# Patient Record
Sex: Male | Born: 1950
Health system: Southern US, Community
[De-identification: ages and names within clinical notes are randomized; demographics above are authoritative.]

## PROBLEM LIST (undated history)

## (undated) DIAGNOSIS — E785 Hyperlipidemia, unspecified: Secondary | ICD-10-CM

## (undated) DIAGNOSIS — K4090 Unilateral inguinal hernia, without obstruction or gangrene, not specified as recurrent: Secondary | ICD-10-CM

## (undated) DIAGNOSIS — G629 Polyneuropathy, unspecified: Secondary | ICD-10-CM

## (undated) DIAGNOSIS — I10 Essential (primary) hypertension: Secondary | ICD-10-CM

## (undated) DIAGNOSIS — F32A Depression, unspecified: Secondary | ICD-10-CM

## (undated) DIAGNOSIS — F329 Major depressive disorder, single episode, unspecified: Secondary | ICD-10-CM

## (undated) DIAGNOSIS — F988 Other specified behavioral and emotional disorders with onset usually occurring in childhood and adolescence: Secondary | ICD-10-CM

## (undated) DIAGNOSIS — R739 Hyperglycemia, unspecified: Secondary | ICD-10-CM

## (undated) HISTORY — PX: OTHER SURGICAL HISTORY: SHX169

## (undated) HISTORY — DX: Hyperlipidemia, unspecified: E78.5

## (undated) HISTORY — DX: Depression, unspecified: F32.A

## (undated) HISTORY — DX: Major depressive disorder, single episode, unspecified: F32.9

## (undated) HISTORY — DX: Essential (primary) hypertension: I10

## (undated) HISTORY — PX: RETINAL LASER PROCEDURE: SHX2339

## (undated) HISTORY — DX: Polyneuropathy, unspecified: G62.9

## (undated) HISTORY — PX: CHOLECYSTECTOMY: SHX55

## (undated) HISTORY — PX: EYE SURGERY: SHX253

## (undated) HISTORY — DX: Other specified behavioral and emotional disorders with onset usually occurring in childhood and adolescence: F98.8

## (undated) HISTORY — DX: Hyperglycemia, unspecified: R73.9

## (undated) HISTORY — DX: Unilateral inguinal hernia, without obstruction or gangrene, not specified as recurrent: K40.90

---

## 2000-09-04 ENCOUNTER — Ambulatory Visit (HOSPITAL_COMMUNITY): Admission: RE | Admit: 2000-09-04 | Discharge: 2000-09-04 | Payer: Self-pay | Admitting: Neurology

## 2000-09-04 ENCOUNTER — Encounter: Payer: Self-pay | Admitting: Neurology

## 2002-08-21 ENCOUNTER — Ambulatory Visit (HOSPITAL_BASED_OUTPATIENT_CLINIC_OR_DEPARTMENT_OTHER): Admission: RE | Admit: 2002-08-21 | Discharge: 2002-08-21 | Payer: Self-pay | Admitting: Geriatric Medicine

## 2004-04-25 ENCOUNTER — Encounter: Admission: RE | Admit: 2004-04-25 | Discharge: 2004-04-25 | Payer: Self-pay | Admitting: Geriatric Medicine

## 2012-01-17 ENCOUNTER — Other Ambulatory Visit: Payer: Self-pay | Admitting: Geriatric Medicine

## 2012-01-17 DIAGNOSIS — M79604 Pain in right leg: Secondary | ICD-10-CM

## 2012-01-18 ENCOUNTER — Ambulatory Visit
Admission: RE | Admit: 2012-01-18 | Discharge: 2012-01-18 | Disposition: A | Discharge status: Home / Self Care | Payer: 59 | Source: Ambulatory Visit | Attending: Geriatric Medicine | Admitting: Geriatric Medicine

## 2012-01-18 DIAGNOSIS — M79604 Pain in right leg: Secondary | ICD-10-CM

## 2015-11-08 DIAGNOSIS — Z23 Encounter for immunization: Secondary | ICD-10-CM | POA: Diagnosis not present

## 2016-04-28 DIAGNOSIS — Z125 Encounter for screening for malignant neoplasm of prostate: Secondary | ICD-10-CM | POA: Diagnosis not present

## 2016-04-28 DIAGNOSIS — E669 Obesity, unspecified: Secondary | ICD-10-CM | POA: Diagnosis not present

## 2016-04-28 DIAGNOSIS — E7800 Pure hypercholesterolemia, unspecified: Secondary | ICD-10-CM | POA: Diagnosis not present

## 2016-04-28 DIAGNOSIS — Z6832 Body mass index (BMI) 32.0-32.9, adult: Secondary | ICD-10-CM | POA: Diagnosis not present

## 2016-04-28 DIAGNOSIS — Z23 Encounter for immunization: Secondary | ICD-10-CM | POA: Diagnosis not present

## 2016-04-28 DIAGNOSIS — Z79899 Other long term (current) drug therapy: Secondary | ICD-10-CM | POA: Diagnosis not present

## 2016-04-28 DIAGNOSIS — F325 Major depressive disorder, single episode, in full remission: Secondary | ICD-10-CM | POA: Diagnosis not present

## 2016-04-28 DIAGNOSIS — Z0000 Encounter for general adult medical examination without abnormal findings: Secondary | ICD-10-CM | POA: Diagnosis not present

## 2016-04-28 DIAGNOSIS — I10 Essential (primary) hypertension: Secondary | ICD-10-CM | POA: Diagnosis not present

## 2016-04-28 DIAGNOSIS — G609 Hereditary and idiopathic neuropathy, unspecified: Secondary | ICD-10-CM | POA: Diagnosis not present

## 2016-08-03 DIAGNOSIS — M1711 Unilateral primary osteoarthritis, right knee: Secondary | ICD-10-CM | POA: Diagnosis not present

## 2016-08-03 DIAGNOSIS — M25561 Pain in right knee: Secondary | ICD-10-CM | POA: Diagnosis not present

## 2016-08-11 DIAGNOSIS — Z6832 Body mass index (BMI) 32.0-32.9, adult: Secondary | ICD-10-CM | POA: Diagnosis not present

## 2016-08-11 DIAGNOSIS — M13161 Monoarthritis, not elsewhere classified, right knee: Secondary | ICD-10-CM | POA: Diagnosis not present

## 2016-08-11 DIAGNOSIS — I10 Essential (primary) hypertension: Secondary | ICD-10-CM | POA: Diagnosis not present

## 2016-08-11 DIAGNOSIS — E669 Obesity, unspecified: Secondary | ICD-10-CM | POA: Diagnosis not present

## 2016-09-04 DIAGNOSIS — Z23 Encounter for immunization: Secondary | ICD-10-CM | POA: Diagnosis not present

## 2016-10-30 DIAGNOSIS — G609 Hereditary and idiopathic neuropathy, unspecified: Secondary | ICD-10-CM | POA: Diagnosis not present

## 2016-10-30 DIAGNOSIS — Z6832 Body mass index (BMI) 32.0-32.9, adult: Secondary | ICD-10-CM | POA: Diagnosis not present

## 2016-10-30 DIAGNOSIS — E7800 Pure hypercholesterolemia, unspecified: Secondary | ICD-10-CM | POA: Diagnosis not present

## 2016-10-30 DIAGNOSIS — E669 Obesity, unspecified: Secondary | ICD-10-CM | POA: Diagnosis not present

## 2016-10-30 DIAGNOSIS — R140 Abdominal distension (gaseous): Secondary | ICD-10-CM | POA: Diagnosis not present

## 2016-10-30 DIAGNOSIS — I10 Essential (primary) hypertension: Secondary | ICD-10-CM | POA: Diagnosis not present

## 2016-12-25 DIAGNOSIS — R51 Headache: Secondary | ICD-10-CM | POA: Diagnosis not present

## 2017-01-29 DIAGNOSIS — R140 Abdominal distension (gaseous): Secondary | ICD-10-CM | POA: Diagnosis not present

## 2017-01-29 DIAGNOSIS — R0789 Other chest pain: Secondary | ICD-10-CM | POA: Diagnosis not present

## 2017-01-29 DIAGNOSIS — Z79899 Other long term (current) drug therapy: Secondary | ICD-10-CM | POA: Diagnosis not present

## 2017-01-29 DIAGNOSIS — N529 Male erectile dysfunction, unspecified: Secondary | ICD-10-CM | POA: Diagnosis not present

## 2017-01-29 DIAGNOSIS — I10 Essential (primary) hypertension: Secondary | ICD-10-CM | POA: Diagnosis not present

## 2017-01-30 ENCOUNTER — Telehealth: Payer: Self-pay | Admitting: Cardiovascular Disease

## 2017-01-30 NOTE — Telephone Encounter (Signed)
Received records from [facility] for appointment on 02/13/17 with Dr Sallyanne Kuster.,  Records put with Dr Victorino December schedule for 02/13/17. lp

## 2017-01-31 DIAGNOSIS — N529 Male erectile dysfunction, unspecified: Secondary | ICD-10-CM | POA: Diagnosis not present

## 2017-02-07 DIAGNOSIS — E291 Testicular hypofunction: Secondary | ICD-10-CM | POA: Diagnosis not present

## 2017-02-07 DIAGNOSIS — Z0000 Encounter for general adult medical examination without abnormal findings: Secondary | ICD-10-CM | POA: Diagnosis not present

## 2017-02-07 DIAGNOSIS — I10 Essential (primary) hypertension: Secondary | ICD-10-CM | POA: Diagnosis not present

## 2017-02-07 DIAGNOSIS — R0789 Other chest pain: Secondary | ICD-10-CM | POA: Diagnosis not present

## 2017-02-07 DIAGNOSIS — Z125 Encounter for screening for malignant neoplasm of prostate: Secondary | ICD-10-CM | POA: Diagnosis not present

## 2017-02-13 ENCOUNTER — Ambulatory Visit (INDEPENDENT_AMBULATORY_CARE_PROVIDER_SITE_OTHER): Payer: PPO | Admitting: Cardiovascular Disease

## 2017-02-13 ENCOUNTER — Encounter: Payer: Self-pay | Admitting: Cardiovascular Disease

## 2017-02-13 VITALS — BP 130/81 | HR 73 | Ht 73.0 in | Wt 235.6 lb

## 2017-02-13 DIAGNOSIS — E7800 Pure hypercholesterolemia, unspecified: Secondary | ICD-10-CM | POA: Diagnosis not present

## 2017-02-13 DIAGNOSIS — I10 Essential (primary) hypertension: Secondary | ICD-10-CM

## 2017-02-13 DIAGNOSIS — R0789 Other chest pain: Secondary | ICD-10-CM

## 2017-02-13 NOTE — Patient Instructions (Signed)
Medication Instructions: Dr Sallyanne Kuster recommends that you continue on your current medications as directed. Please refer to the Current Medication list given to you today.  Labwork: NONE ORDERED  Testing/Procedures: 1. Exercise Tolerance Test - Your physician has requested that you have an exercise tolerance test. For further information please visit [email or web address]. Please also follow instruction sheet, as given. >>HOLD Bisoprolol on the day of the test  Follow-up: Dr Sallyanne Kuster recommends that you follow-up with him as needed.  If you need a refill on your cardiac medications before your next appointment, please call your pharmacy.

## 2017-02-13 NOTE — Progress Notes (Signed)
Cardiology Consultation Note    Date:  02/13/2017   ID:  DIESEL ZUEHLKE, DOB [birth year], MRN [id number]  PCP:  Mathews Argyle, MD  Cardiologist:   Sanda Klein, MD   Reason for consultation     . New Evaluation    chest pressure     History of Present Illness:  Jason Franco is a 66 y.o. male with a history of treated hypertension and hyperlipidemia recently reporting chest tightness at rest. For the last 3 months he has had intolerable belching/abdominal bloating. This occurs at rest and sometimes keeps him up at night or wakes him up early in the morning. He has uncontrollable burping. He has not had vomiting but has had recurrent dry heaves, which sometimes leads to chest tightness. The symptoms do not occur during physical activity. If anything being physically active makes him tolerated the complaint better. 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 This does not cause angina or chest tightness. Treatment with antacids and PPI has not helped.   He denies claudication, lower extremity edema, palpitations, syncope, focal neurological complaints, other cardiovascular problems. In the past he had a treadmill stress test that was not diagnostic since the beta blocker pre-dented his heart rate from increasingly. He had a normal echocardiogram except for mild mitral insufficiency. He has tried taking atorvastatin, rosuvastatin and simvastatin, but did not tolerate them due to muscle side effects.  Past Medical History:  Diagnosis Date  . ADD (attention deficit disorder)   . Cervical spine disease   . Depression   . Hyperglycemia   . Hyperlipidemia   . Hypertension   . Inguinal hernia    right; only felt with coughing  . Peripheral neuropathy [facility])     Past Surgical History:  Procedure Laterality Date  . GALLBLADDER SURGERY      Current Medications: Outpatient Medications Prior  to Visit  Medication Sig Dispense Refill  . bisoprolol (ZEBETA) 10 MG tablet Take 10 mg by mouth daily.    Marland Kitchen FLUoxetine (PROZAC) 20 MG capsule Take 20 mg by mouth daily as needed.    . lansoprazole (PREVACID) 15 MG capsule Take 15 mg by mouth daily at 12 noon.    . traZODone (DESYREL) 50 MG tablet Take 50 mg by mouth at bedtime as needed for sleep.    . fluvastatin (LESCOL) 20 MG capsule Take 20 mg by mouth at bedtime.     No facility-administered medications prior to visit.      Allergies:   Atorvastatin; Rosuvastatin calcium; Simvastatin; and Tamsulosin hcl   Social History   Social History  . Marital status: Married    Spouse name: N/A  . Number of children: N/A  . Years of education: N/A   Social History Main Topics  . Smoking status: Never Smoker  . Smokeless tobacco: Never Used  . Alcohol use No  . Drug use: Unknown  . Sexual activity: Not Asked   Other Topics Concern  . None   Social History Narrative  . None     Family History:  The patient's family history includes Alzheimer's disease in his brother and mother; COPD in his sister; Cancer in his brother and father; Diabetes in his brother; Fibromyalgia in his sister; Healthy in his son, son, and son; Heart failure in his mother and sister; Parkinson's disease in his father.   ROS:   Please see the history of present illness.  ROS All other systems reviewed and are negative.   PHYSICAL EXAM:   VS:  BP 130/81   Pulse 73   Ht 6\' 1"  (1.854 m)   Wt 106.9 kg (235 lb 9.6 oz)   BMI 31.08 kg/m    GEN: Well nourished, well developed, in no acute distress  HEENT: normal  Neck: no JVD, carotid bruits, or masses Cardiac: RRR; no murmurs, rubs, or gallops,no edema  Respiratory:  clear to auscultation bilaterally, normal work of breathing GI: soft, nontender, nondistended, + BS MS: no deformity or atrophy  Skin: warm and dry, no rash, varicose veins of both calves and thighs, possibly a thrombosed varix on the  medial surface of the lower right thigh Neuro:  Alert and Oriented x 3, Strength and sensation are intact Psych: euthymic mood, full affect  Wt Readings from Last 3 Encounters:  02/13/17 106.9 kg (235 lb 9.6 oz)      Studies/Labs Reviewed:   EKG:  EKG is ordered today.  The ekg ordered today demonstratesNormal sinus rhythm, normal tracing, QTC 413 ms  Recent Labs: Glucose 74, creatinine 1.19, potassium 4.3, normal liver function tests, TSH 1.14, mildly reduced testosterone 223   Lipid Panel: not available   Additional studies/ records that were reviewed today include: Notes from Dr. Felipa Eth   ASSESSMENT:    1. Chest pressure   2. Essential hypertension   3. Hypercholesterolemia      PLAN:  In order of problems listed above:  1. Atypical CP: This sounds distinctly not anginal. It occurs at rest and improves during heavy physical activity. He does have coronary risk factors include hypertension, hyperlipidemia, male gender and relatively advanced age. His mother and sister have heart failure, but it does not sound like they have coronary problems. I recommended that he have a treadmill stress test, after stopping his beta blocker for at least 24 hours.  2. HTN: Well-controlled . 3. HLP: intolerant to statin, get most recent results from Dr. Felipa Eth    if the treadmill stress test is normal, without exercise-induced chest tightness or ECG changes, I would not pursue additional cardiac workup at this time. Consider referral to GI for upper endoscopy.   Medication Adjustments/Labs and Tests Ordered: Current medicines are reviewed at length with the patient today.  Concerns regarding medicines are outlined above.  Medication changes, Labs and Tests ordered today are listed in the Patient Instructions below. Patient Instructions  Medication Instructions: Dr Sallyanne Kuster recommends that you continue on your current medications as directed. Please refer to the Current Medication list  given to you today.  Labwork: NONE ORDERED  Testing/Procedures: 1. Exercise Tolerance Test - Your physician has requested that you have an exercise tolerance test. For further information please visit [email or web address]. Please also follow instruction sheet, as given. >>HOLD Bisoprolol on the day of the test  Follow-up: Dr Sallyanne Kuster recommends that you follow-up with him as needed.  If you need a refill on your cardiac medications before your next appointment, please call your pharmacy.    Signed, Sanda Klein, MD  02/13/2017 1:11 PM    [facility] [address], [place], [place]  [address] Phone: [phone]; Fax: [phone]

## 2017-02-14 ENCOUNTER — Telehealth (HOSPITAL_COMMUNITY): Payer: Self-pay

## 2017-02-14 NOTE — Telephone Encounter (Signed)
Encounter complete. 

## 2017-02-16 ENCOUNTER — Ambulatory Visit (HOSPITAL_COMMUNITY)
Admission: RE | Admit: 2017-02-16 | Discharge: 2017-02-16 | Disposition: A | Discharge status: Home / Self Care | Payer: PPO | Source: Ambulatory Visit | Attending: Cardiovascular Disease | Admitting: Cardiovascular Disease

## 2017-02-16 DIAGNOSIS — R0789 Other chest pain: Secondary | ICD-10-CM | POA: Diagnosis not present

## 2017-02-16 DIAGNOSIS — R0600 Dyspnea, unspecified: Secondary | ICD-10-CM | POA: Insufficient documentation

## 2017-02-16 LAB — EXERCISE TOLERANCE TEST
CHL RATE OF PERCEIVED EXERTION: 18
CSEPED: 9 min
CSEPEW: 10.8 METS
CSEPHR: 86 %
Exercise duration (sec): 27 s
MPHR: 154 {beats}/min
Peak HR: 133 {beats}/min
Rest HR: 63 {beats}/min

## 2017-02-22 ENCOUNTER — Telehealth: Payer: Self-pay | Admitting: Cardiovascular Disease

## 2017-02-22 NOTE — Telephone Encounter (Signed)
F/u Message  Pt returning RN call about ETT results. Please call back  To discuss

## 2017-03-01 NOTE — Telephone Encounter (Signed)
pt aware of results  

## 2017-03-13 DIAGNOSIS — I10 Essential (primary) hypertension: Secondary | ICD-10-CM | POA: Diagnosis not present

## 2017-03-13 DIAGNOSIS — R197 Diarrhea, unspecified: Secondary | ICD-10-CM | POA: Diagnosis not present

## 2017-03-15 ENCOUNTER — Encounter (HOSPITAL_COMMUNITY): Payer: Self-pay | Admitting: *Deleted

## 2017-03-15 ENCOUNTER — Other Ambulatory Visit: Payer: Self-pay | Admitting: Gastroenterology

## 2017-03-20 ENCOUNTER — Ambulatory Visit (HOSPITAL_COMMUNITY)
Admission: RE | Admit: 2017-03-20 | Discharge: 2017-03-20 | Disposition: A | Discharge status: Home / Self Care | Payer: PPO | Source: Ambulatory Visit | Attending: Gastroenterology | Admitting: Gastroenterology

## 2017-03-20 ENCOUNTER — Encounter (HOSPITAL_COMMUNITY): Payer: Self-pay | Admitting: *Deleted

## 2017-03-20 ENCOUNTER — Encounter (HOSPITAL_COMMUNITY)
Admission: RE | Disposition: A | Discharge status: Home / Self Care | Payer: Self-pay | Source: Ambulatory Visit | Attending: Gastroenterology

## 2017-03-20 ENCOUNTER — Ambulatory Visit (HOSPITAL_COMMUNITY): Payer: PPO | Admitting: Certified Registered"

## 2017-03-20 DIAGNOSIS — Z9049 Acquired absence of other specified parts of digestive tract: Secondary | ICD-10-CM | POA: Insufficient documentation

## 2017-03-20 DIAGNOSIS — K900 Celiac disease: Secondary | ICD-10-CM | POA: Diagnosis not present

## 2017-03-20 DIAGNOSIS — R197 Diarrhea, unspecified: Secondary | ICD-10-CM | POA: Diagnosis not present

## 2017-03-20 DIAGNOSIS — F988 Other specified behavioral and emotional disorders with onset usually occurring in childhood and adolescence: Secondary | ICD-10-CM | POA: Diagnosis not present

## 2017-03-20 DIAGNOSIS — R140 Abdominal distension (gaseous): Secondary | ICD-10-CM | POA: Diagnosis not present

## 2017-03-20 DIAGNOSIS — K5730 Diverticulosis of large intestine without perforation or abscess without bleeding: Secondary | ICD-10-CM | POA: Diagnosis not present

## 2017-03-20 DIAGNOSIS — M5030 Other cervical disc degeneration, unspecified cervical region: Secondary | ICD-10-CM | POA: Insufficient documentation

## 2017-03-20 DIAGNOSIS — D122 Benign neoplasm of ascending colon: Secondary | ICD-10-CM | POA: Insufficient documentation

## 2017-03-20 DIAGNOSIS — F329 Major depressive disorder, single episode, unspecified: Secondary | ICD-10-CM | POA: Diagnosis not present

## 2017-03-20 DIAGNOSIS — I10 Essential (primary) hypertension: Secondary | ICD-10-CM | POA: Diagnosis not present

## 2017-03-20 DIAGNOSIS — K52839 Microscopic colitis, unspecified: Secondary | ICD-10-CM | POA: Diagnosis not present

## 2017-03-20 DIAGNOSIS — R142 Eructation: Secondary | ICD-10-CM | POA: Insufficient documentation

## 2017-03-20 DIAGNOSIS — G629 Polyneuropathy, unspecified: Secondary | ICD-10-CM | POA: Diagnosis not present

## 2017-03-20 DIAGNOSIS — K529 Noninfective gastroenteritis and colitis, unspecified: Secondary | ICD-10-CM | POA: Diagnosis not present

## 2017-03-20 DIAGNOSIS — K317 Polyp of stomach and duodenum: Secondary | ICD-10-CM | POA: Insufficient documentation

## 2017-03-20 DIAGNOSIS — E7800 Pure hypercholesterolemia, unspecified: Secondary | ICD-10-CM | POA: Diagnosis not present

## 2017-03-20 DIAGNOSIS — Z1211 Encounter for screening for malignant neoplasm of colon: Secondary | ICD-10-CM | POA: Diagnosis not present

## 2017-03-20 HISTORY — PX: ESOPHAGOGASTRODUODENOSCOPY (EGD) WITH PROPOFOL: SHX5813

## 2017-03-20 HISTORY — PX: COLONOSCOPY WITH PROPOFOL: SHX5780

## 2017-03-20 SURGERY — COLONOSCOPY WITH PROPOFOL
Anesthesia: Monitor Anesthesia Care

## 2017-03-20 MED ORDER — SODIUM CHLORIDE 0.9 % IV SOLN: INTRAVENOUS | Status: DC

## 2017-03-20 MED ORDER — PROPOFOL 10 MG/ML IV BOLUS
INTRAVENOUS | Status: AC
  Filled 2017-03-20: qty 40

## 2017-03-20 MED ORDER — ONDANSETRON HCL 4 MG/2ML IJ SOLN
INTRAMUSCULAR | Status: DC | PRN
  Administered 2017-03-20: 4 mg via INTRAVENOUS

## 2017-03-20 MED ORDER — LIDOCAINE 2% (20 MG/ML) 5 ML SYRINGE
INTRAMUSCULAR | Status: DC | PRN
  Administered 2017-03-20: 40 mg via INTRAVENOUS

## 2017-03-20 MED ORDER — LACTATED RINGERS IV SOLN
INTRAVENOUS | Status: DC
  Administered 2017-03-20 (×2): via INTRAVENOUS

## 2017-03-20 MED ORDER — PROPOFOL 10 MG/ML IV BOLUS
INTRAVENOUS | Status: DC | PRN
  Administered 2017-03-20 (×13): 40 mg via INTRAVENOUS

## 2017-03-20 MED ORDER — PHENYLEPHRINE 40 MCG/ML (10ML) SYRINGE FOR IV PUSH (FOR BLOOD PRESSURE SUPPORT)
PREFILLED_SYRINGE | INTRAVENOUS | Status: DC | PRN
  Administered 2017-03-20: 120 ug via INTRAVENOUS

## 2017-03-20 MED ORDER — PROPOFOL 10 MG/ML IV BOLUS
INTRAVENOUS | Status: AC
  Filled 2017-03-20: qty 20

## 2017-03-20 SURGICAL SUPPLY — 24 items

## 2017-03-20 NOTE — Anesthesia Preprocedure Evaluation (Addendum)
Anesthesia Evaluation  Patient identified by MRN, date of birth, ID band Patient awake    Reviewed: Allergy & Precautions, NPO status , Patient's Chart, lab work & pertinent test results  Airway Mallampati: I  TM Distance: >3 FB Neck ROM: Full    Dental  (+) Dental Advisory Given   Pulmonary    Pulmonary exam normal        Cardiovascular hypertension, Pt. on medications Normal cardiovascular exam     Neuro/Psych    GI/Hepatic   Endo/Other    Renal/GU      Musculoskeletal   Abdominal   Peds  Hematology   Anesthesia Other Findings   Reproductive/Obstetrics                            Anesthesia Physical Anesthesia Plan  ASA: II  Anesthesia Plan: MAC   Post-op Pain Management:    Induction: Intravenous  Airway Management Planned: Simple Face Mask  Additional Equipment:   Intra-op Plan:   Post-operative Plan:   Informed Consent: I have reviewed the patients History and Physical, chart, labs and discussed the procedure including the risks, benefits and alternatives for the proposed anesthesia with the patient or authorized representative who has indicated his/her understanding and acceptance.     Plan Discussed with: CRNA and Surgeon  Anesthesia Plan Comments:         Anesthesia Quick Evaluation

## 2017-03-20 NOTE — Anesthesia Postprocedure Evaluation (Signed)
Anesthesia Post Note  Patient: Wayburn Shaler Doolittle  Procedure(s) Performed: Procedure(s) (LRB): COLONOSCOPY WITH PROPOFOL (N/A) ESOPHAGOGASTRODUODENOSCOPY (EGD) WITH PROPOFOL (N/A)  Patient location during evaluation: PACU Anesthesia Type: General Level of consciousness: awake and alert Pain management: pain level controlled Vital Signs Assessment: post-procedure vital signs reviewed and stable Respiratory status: spontaneous breathing, nonlabored ventilation, respiratory function stable and patient connected to nasal cannula oxygen Cardiovascular status: blood pressure returned to baseline and stable Postop Assessment: no signs of nausea or vomiting Anesthetic complications: no       Last Vitals:  Vitals:   03/20/17 1201 03/20/17 1209  BP: 118/70 (!) 142/75  Pulse: 60 (!) 58  Resp: 18 12  Temp:      Last Pain:  Vitals:   03/20/17 1149  TempSrc: Oral                 Jouri Threat DAVID

## 2017-03-20 NOTE — Transfer of Care (Signed)
Immediate Anesthesia Transfer of Care Note  Patient: Jason Franco  Procedure(s) Performed: Procedure(s): COLONOSCOPY WITH PROPOFOL (N/A) ESOPHAGOGASTRODUODENOSCOPY (EGD) WITH PROPOFOL (N/A)  Patient Location: PACU and Endoscopy Unit  Anesthesia Type:MAC  Level of Consciousness: awake and alert   Airway & Oxygen Therapy: Patient Spontanous Breathing and Patient connected to nasal cannula oxygen  Post-op Assessment: Report given to RN and Post -op Vital signs reviewed and stable  Post vital signs: Reviewed and stable  Last Vitals:  Vitals:   03/20/17 1018  BP: (!) 144/83  Pulse: (!) 56  Resp: (!) 21  Temp: 36.4 C    Last Pain:  Vitals:   03/20/17 1018  TempSrc: Oral         Complications: No apparent anesthesia complications

## 2017-03-20 NOTE — Op Note (Signed)
[facility] Patient Name: Jason Franco Procedure Date: 03/20/2017 MRN: [id number] Attending MD: Garlan Fair , MD Date of Birth: 02/26/1951 CSN: [id number] Age: 66 Admit Type: Outpatient Procedure:                Colonoscopy Indications:              Screening for colorectal malignant neoplasm Providers:                Garlan Fair, MD, Carolynn Comment, RN,                            William Dalton, Technician Referring MD:              Medicines:                Propofol per Anesthesia Complications:            No immediate complications. Estimated Blood Loss:     Estimated blood loss was minimal. Procedure:                Pre-Anesthesia Assessment:                           - Prior to the procedure, a History and Physical                            was performed, and patient medications and                            allergies were reviewed. The patient's tolerance of                            previous anesthesia was also reviewed. The risks                            and benefits of the procedure and the sedation                            options and risks were discussed with the patient.                            All questions were answered, and informed consent                            was obtained. Prior Anticoagulants: The patient has                            taken no previous anticoagulant or antiplatelet                            agents. ASA Grade Assessment: II - A patient with                            mild systemic disease. After reviewing the risks  and benefits, the patient was deemed in                            satisfactory condition to undergo the procedure.                           After obtaining informed consent, the colonoscope                            was passed under direct vision. Throughout the                            procedure, the patient's blood pressure, pulse, and     oxygen saturations were monitored continuously. The                            EC-[id number] ([id number]) scope was introduced through                            the anus and advanced to the the cecum, identified                            by appendiceal orifice and ileocecal valve. The                            colonoscopy was performed without difficulty. The                            patient tolerated the procedure well. The quality                            of the bowel preparation was good. The appendiceal                            orifice and the rectum were photographed. Scope In: 11:20:05 AM Scope Out: 11:40:43 AM Scope Withdrawal Time: 0 hours 11 minutes 18 seconds  Total Procedure Duration: 0 hours 20 minutes 38 seconds  Findings:      The perianal and digital rectal examinations were normal.      The colon appeared normal except for left colonic diverticulosis.       Biopsies for histology were taken with a cold forceps from the cecum,       ascending colon and descending colon for evaluation of microscopic       colitis. A 2 mm polyp in the proximal ascending colon was added to the       random colon biopsy pathology bottle. Impression:               - The entire examined colon is normal. Biopsied. Moderate Sedation:      N/A- Per Anesthesia Care Recommendation:           - Patient has a contact number available for                            emergencies. The signs and symptoms of potential  delayed complications were discussed with the                            patient. Return to normal activities tomorrow.                            Written discharge instructions were provided to the                            patient.                           - Repeat colonoscopy date to be determined after                            pending pathology results are reviewed for                            screening purposes.                           - Resume  previous diet.                           - Continue present medications. Procedure Code(s):        --- Professional ---                           [phone], Colonoscopy, flexible; with biopsy, single                            or multiple Diagnosis Code(s):        --- Professional ---                           Z12.11, Encounter for screening for malignant                            neoplasm of colon CPT copyright 2016 American Medical Association. All rights reserved. The codes documented in this report are preliminary and upon coder review may  be revised to meet current compliance requirements. Earle Gell, MD Garlan Fair, MD 03/20/2017 11:50:47 AM This report has been signed electronically. Number of Addenda: 0

## 2017-03-20 NOTE — H&P (Signed)
Problem: Chronic watery diarrhea with abdominal bloating and frequent belching. Normal screening colonoscopy was performed in 2008. Chronic fluoxetine and Prevacid use which are associated with microscopic colitis.  History: The patient is a 66 year old male born 24-Jul-1951. For the past few months, he has had excessive belching with upper abdominal bloating and a "queasy" stomach without dysphagia, odynophagia, heartburn, nausea, vomiting, or weight loss. He also has had intermittent watery diarrhea without gastrointestinal bleeding or nocturnal diarrhea.  In 2006, his esophagogastroduodenoscopy did not show Barrett's esophagus. He underwent a normal screening colonoscopy in 2008. He denies a history of peptic ulcer disease or gluten sensitivity.  He is scheduled to undergo diagnostic esophagogastroduodenoscopy with screen for celiac disease followed by screening colonoscopy with screen for microscopic colitis.  Past medical history: Hypercholesterolemia. Hypertension. Depression. Cervical spine disease. Attention deficit disorder. Right inguinal hernia. Peripheral neuropathy. Cholecystectomy. Left inguinal herniorrhaphy. Right carpal tunnel surgery.  Exam: The patient is alert and lying comfortably on the endoscopy stretcher. Abdomen is soft and nontender to palpation. Lungs are clear to auscultation. Cardiac exam reveals a right rhythm.  Plan: Proceed with diagnostic esophagogastroduodenoscopy with screen for celiac disease followed by screening colonoscopy and screen for microscopic colitis.

## 2017-03-20 NOTE — Discharge Instructions (Signed)
Colonoscopy, Adult, Care After  This sheet gives you information about how to care for yourself after your procedure. Your doctor may also give you more specific instructions. If you have problems or questions, call your doctor.  Follow these instructions at home:  General instructions     · For the first 24 hours after the procedure:  ? Do not drive or use machinery.  ? Do not sign important documents.  ? Do not drink alcohol.  ? Do your daily activities more slowly than normal.  ? Eat foods that are soft and easy to digest.  ? Rest often.  · Take over-the-counter or prescription medicines only as told by your doctor.  · It is up to you to get the results of your procedure. Ask your doctor, or the department performing the procedure, when your results will be ready.  To help cramping and bloating:   · Try walking around.  · Put heat on your belly (abdomen) as told by your doctor. Use a heat source that your doctor recommends, such as a moist heat pack or a heating pad.  ? Put a towel between your skin and the heat source.  ? Leave the heat on for 20-30 minutes.  ? Remove the heat if your skin turns bright red. This is especially important if you cannot feel pain, heat, or cold. You can get burned.  Eating and drinking   · Drink enough fluid to keep your pee (urine) clear or pale yellow.  · Return to your normal diet as told by your doctor. Avoid heavy or fried foods that are hard to digest.  · Avoid drinking alcohol for as long as told by your doctor.  Contact a doctor if:  · You have blood in your poop (stool) 2-3 days after the procedure.  Get help right away if:  · You have more than a small amount of blood in your poop.  · You see large clumps of tissue (blood clots) in your poop.  · Your belly is swollen.  · You feel sick to your stomach (nauseous).  · You throw up (vomit).  · You have a fever.  · You have belly pain that gets worse, and medicine does not help your pain.  This information is not intended to  replace advice given to you by your health care provider. Make sure you discuss any questions you have with your health care provider.  Document Released: 01/06/2011 Document Revised: 08/28/2016 Document Reviewed: 08/28/2016  Elsevier Interactive Patient Education © 2017 [employer]  Upper Endoscopy, Care After  Refer to this sheet in the next few weeks. These instructions provide you with information about caring for yourself after your procedure. Your health care provider may also give you more specific instructions. Your treatment has been planned according to current medical practices, but problems sometimes occur. Call your health care provider if you have any problems or questions after your procedure.  What can I expect after the procedure?  After the procedure, it is common to have:  · A sore throat.  · Bloating.  · Nausea.    Follow these instructions at home:  · Follow instructions from your health care provider about what to eat or drink after your procedure.  · Return to your normal activities as told by your health care provider. Ask your health care provider what activities are safe for you.  · Take over-the-counter and prescription medicines only as told by your health care provider.  ·   Do not drive for 24 hours if you received a sedative.  · Keep all follow-up visits as told by your health care provider. This is important.  Contact a health care provider if:  · You have a sore throat that lasts longer than one day.  · You have trouble swallowing.  Get help right away if:  · You have a fever.  · You vomit blood or your vomit looks like coffee grounds.  · You have bloody, black, or tarry stools.  · You have a severe sore throat or you cannot swallow.  · You have difficulty breathing.  · You have severe pain in your chest or belly.  This information is not intended to replace advice given to you by your health care provider. Make sure you discuss any questions you have with your health care  provider.  Document Released: 06/04/2012 Document Revised: 05/11/2016 Document Reviewed: 09/16/2015  Elsevier Interactive Patient Education © 2017 [employer]

## 2017-03-20 NOTE — Op Note (Signed)
[facility] Patient Name: Jason Franco Procedure Date: 03/20/2017 MRN: [id number] Attending MD: Garlan Fair , MD Date of Birth: 1950-12-27 CSN: [id number] Age: 66 Admit Type: Outpatient Procedure:                Upper GI endoscopy Indications:              Diarrhea Providers:                Garlan Fair, MD, Carolynn Comment, RN,                            William Dalton, Technician Referring MD:              Medicines:                Propofol per Anesthesia Complications:            No immediate complications. Estimated Blood Loss:     Estimated blood loss was minimal. Procedure:                Pre-Anesthesia Assessment:                           - Prior to the procedure, a History and Physical                            was performed, and patient medications and                            allergies were reviewed. The patient's tolerance of                            previous anesthesia was also reviewed. The risks                            and benefits of the procedure and the sedation                            options and risks were discussed with the patient.                            All questions were answered, and informed consent                            was obtained. Prior Anticoagulants: The patient has                            taken no previous anticoagulant or antiplatelet                            agents. ASA Grade Assessment: II - A patient with                            mild systemic disease. After reviewing the risks  and benefits, the patient was deemed in                            satisfactory condition to undergo the procedure.                           - Prior to the procedure, a History and Physical                            was performed, and patient medications and                            allergies were reviewed. The patient's tolerance of                            previous anesthesia was also  reviewed. The risks                            and benefits of the procedure and the sedation                            options and risks were discussed with the patient.                            All questions were answered, and informed consent                            was obtained. Prior Anticoagulants: The patient has                            taken no previous anticoagulant or antiplatelet                            agents. ASA Grade Assessment: II - A patient with                            mild systemic disease. After reviewing the risks                            and benefits, the patient was deemed in                            satisfactory condition to undergo the procedure.                           After obtaining informed consent, the endoscope was                            passed under direct vision. Throughout the                            procedure, the patient's blood pressure, pulse, and  oxygen saturations were monitored continuously. The                            Endoscope was introduced through the mouth, and                            advanced to the second part of duodenum. The upper                            GI endoscopy was accomplished without difficulty.                            The patient tolerated the procedure well. Scope In: Scope Out: Findings:      The Z-line was regular and was found 42 cm from the incisors.      The examined esophagus was normal.      The entire examined stomach was normal except for the presence of       scattered <3 mm benign fundic gland appearing sessile polyps in the       fundus..      The examined duodenum was normal. Biopsies for histology were taken with       a cold forceps for evaluation of celiac disease. Impression:               - Z-line regular, 42 cm from the incisors.                           - Normal esophagus.                           - Normal stomach.                            - Normal examined duodenum. Biopsied. Moderate Sedation:      N/A- Per Anesthesia Care Recommendation:           - Patient has a contact number available for                            emergencies. The signs and symptoms of potential                            delayed complications were discussed with the                            patient. Return to normal activities tomorrow.                            Written discharge instructions were provided to the                            patient.                           - Await pathology results.                           -  Resume previous diet.                           - Continue present medications. Procedure Code(s):        --- Professional ---                           [phone], Esophagogastroduodenoscopy, flexible,                            transoral; with biopsy, single or multiple Diagnosis Code(s):        --- Professional ---                           R19.7, Diarrhea, unspecified CPT copyright 2016 [employer]. All rights reserved. The codes documented in this report are preliminary and upon coder review may  be revised to meet current compliance requirements. Earle Gell, MD Garlan Fair, MD 03/20/2017 11:56:00 AM This report has been signed electronically. Number of Addenda: 0

## 2017-03-21 ENCOUNTER — Encounter (HOSPITAL_COMMUNITY): Payer: Self-pay | Admitting: Gastroenterology

## 2017-05-09 DIAGNOSIS — I10 Essential (primary) hypertension: Secondary | ICD-10-CM | POA: Diagnosis not present

## 2017-05-09 DIAGNOSIS — E291 Testicular hypofunction: Secondary | ICD-10-CM | POA: Diagnosis not present

## 2017-06-01 DIAGNOSIS — Z79899 Other long term (current) drug therapy: Secondary | ICD-10-CM | POA: Diagnosis not present

## 2017-06-01 DIAGNOSIS — I10 Essential (primary) hypertension: Secondary | ICD-10-CM | POA: Diagnosis not present

## 2017-06-01 DIAGNOSIS — E291 Testicular hypofunction: Secondary | ICD-10-CM | POA: Diagnosis not present

## 2017-06-01 DIAGNOSIS — Z23 Encounter for immunization: Secondary | ICD-10-CM | POA: Diagnosis not present

## 2017-06-01 DIAGNOSIS — R7301 Impaired fasting glucose: Secondary | ICD-10-CM | POA: Diagnosis not present

## 2017-06-01 DIAGNOSIS — F458 Other somatoform disorders: Secondary | ICD-10-CM | POA: Diagnosis not present

## 2017-06-01 DIAGNOSIS — E7800 Pure hypercholesterolemia, unspecified: Secondary | ICD-10-CM | POA: Diagnosis not present

## 2017-09-04 DIAGNOSIS — E291 Testicular hypofunction: Secondary | ICD-10-CM | POA: Diagnosis not present

## 2017-09-04 DIAGNOSIS — I10 Essential (primary) hypertension: Secondary | ICD-10-CM | POA: Diagnosis not present

## 2017-09-04 DIAGNOSIS — Z23 Encounter for immunization: Secondary | ICD-10-CM | POA: Diagnosis not present

## 2017-09-04 DIAGNOSIS — R140 Abdominal distension (gaseous): Secondary | ICD-10-CM | POA: Diagnosis not present

## 2017-09-04 DIAGNOSIS — E669 Obesity, unspecified: Secondary | ICD-10-CM | POA: Diagnosis not present

## 2017-09-04 DIAGNOSIS — Z79899 Other long term (current) drug therapy: Secondary | ICD-10-CM | POA: Diagnosis not present

## 2017-09-17 DIAGNOSIS — R102 Pelvic and perineal pain: Secondary | ICD-10-CM | POA: Diagnosis not present

## 2017-10-08 DIAGNOSIS — H43812 Vitreous degeneration, left eye: Secondary | ICD-10-CM | POA: Diagnosis not present

## 2017-10-08 DIAGNOSIS — H3562 Retinal hemorrhage, left eye: Secondary | ICD-10-CM | POA: Diagnosis not present

## 2017-10-08 DIAGNOSIS — H2513 Age-related nuclear cataract, bilateral: Secondary | ICD-10-CM | POA: Diagnosis not present

## 2017-10-08 DIAGNOSIS — H33312 Horseshoe tear of retina without detachment, left eye: Secondary | ICD-10-CM | POA: Diagnosis not present

## 2017-10-08 DIAGNOSIS — H3320 Serous retinal detachment, unspecified eye: Secondary | ICD-10-CM | POA: Diagnosis not present

## 2017-10-08 DIAGNOSIS — H43813 Vitreous degeneration, bilateral: Secondary | ICD-10-CM | POA: Diagnosis not present

## 2017-10-08 DIAGNOSIS — H33321 Round hole, right eye: Secondary | ICD-10-CM | POA: Diagnosis not present

## 2017-10-08 DIAGNOSIS — H33311 Horseshoe tear of retina without detachment, right eye: Secondary | ICD-10-CM | POA: Diagnosis not present

## 2017-10-08 DIAGNOSIS — H33322 Round hole, left eye: Secondary | ICD-10-CM | POA: Diagnosis not present

## 2017-10-09 DIAGNOSIS — H33312 Horseshoe tear of retina without detachment, left eye: Secondary | ICD-10-CM | POA: Diagnosis not present

## 2017-10-16 NOTE — Progress Notes (Signed)
[facility] Note  10/17/2017     CHIEF COMPLAINT Patient presents for Retina Evaluation   HISTORY OF PRESENT ILLNESS: Jason Franco is a 66 y.o. male who presents to the clinic today for:   HPI    Retina Evaluation  In both eyes.  Associated Symptoms Floaters.  Negative for Flashes, Distortion, Pain, Photophobia, Trauma, Jaw Claudication, Fever, Fatigue, Blind Spot, Redness, Glare, Scalp Tenderness, Shoulder/Hip pain and Weight Loss.  Context:  near vision, mid-range vision and distance vision.  Treatments tried include laser.  Response to treatment was mild improvement.  I, the attending physician,  performed the HPI with the patient and updated documentation appropriately.        Comments  Pt presents on the referral of Dr. Zenia Resides for concern of RD OS with HST and operculated hole;  Pt states that Dr. Posey Pronto did a laser in OS due to RT; Pt states that he was told there "was blood under the tear"; Pt reports he is seeing floaters occasionally OS; Pt reports that when this began he was seeing floaters and flashes of light, states that was x 2 months ago; Pt wishes to establish care with Dr. Coralyn Pear to follow retinal concerns; Pt denies taking gtts, denies taking eye vits;   Pt denies having any ocular trauma as a child;      Last edited by Bernarda Caffey, MD on 10/17/2017 10:17 AM. (History)      Referring physician: Debbra Riding, MD [address] [place], [place] [address]  HISTORICAL INFORMATION:   Selected notes from the MEDICAL RECORD NUMBER Referral from Dr. Zenia Resides for retina eval Pt with history of HST and operculated hole OS noted 10.22.18, was emergently referred to Dr. Posey Pronto who performed laser retinopexy OS 10.22.18 Ocular Hx- cataract OU; PVD OU;  PMH- HTN; hypercholesterolemia;    CURRENT MEDICATIONS: Current Outpatient Prescriptions (Ophthalmic Drugs)  Medication Sig  . prednisoLONE acetate (PRED FORTE) 1 % ophthalmic  suspension Place 1 drop into the left eye 4 (four) times daily.   No current facility-administered medications for this visit.  (Ophthalmic Drugs)   Current Outpatient Prescriptions (Other)  Medication Sig  . ANDRODERM 2 MG/24HR PT24 Place 1 patch onto the skin daily.  . bisoprolol (ZEBETA) 10 MG tablet Take 1 tablet by mouth every evening.  Marland Kitchen FLUoxetine (PROZAC) 20 MG capsule Take 20 mg by mouth every evening.   . lansoprazole (PREVACID) 15 MG capsule Take 15 mg by mouth every evening.   . traZODone (DESYREL) 50 MG tablet Take 50 mg by mouth at bedtime as needed for sleep.   No current facility-administered medications for this visit.  (Other)      REVIEW OF SYSTEMS: ROS    Positive for: Cardiovascular, Eyes   Negative for: Constitutional, Gastrointestinal, Neurological, Skin, Genitourinary, Musculoskeletal, HENT, Endocrine, Respiratory, Psychiatric, Allergic/Imm, Heme/Lymph   Last edited by Alyse Low on 10/17/2017  8:26 AM. (History)       ALLERGIES Allergies  Allergen Reactions  . Atorvastatin Other (See Comments)    Muscle pain  . Rosuvastatin Calcium Other (See Comments)    Muscle pain  . Simvastatin Other (See Comments)    Muscle pain  . Tamsulosin Hcl Other (See Comments)    Lightheadedness     PAST MEDICAL HISTORY Past Medical History:  Diagnosis Date  . ADD (attention deficit disorder)    mild  . Depression   . Hyperglycemia   . Hyperlipidemia   .  Hypertension   . Inguinal hernia    right; only felt with coughing  . Peripheral neuropathy    Past Surgical History:  Procedure Laterality Date  . CHOLECYSTECTOMY    . COLONOSCOPY WITH PROPOFOL N/A 03/20/2017   Procedure: COLONOSCOPY WITH PROPOFOL;  Surgeon: Garlan Fair, MD;  Location: [facility];  Service: Endoscopy;  Laterality: N/A;  . colonscopy  5 yrs ago  . ESOPHAGOGASTRODUODENOSCOPY (EGD) WITH PROPOFOL N/A 03/20/2017   Procedure: ESOPHAGOGASTRODUODENOSCOPY (EGD) WITH PROPOFOL;  Surgeon:  Garlan Fair, MD;  Location: [facility];  Service: Endoscopy;  Laterality: N/A;    FAMILY HISTORY Family History  Problem Relation Age of Onset  . Heart failure Mother   . Alzheimer's disease Mother   . Cancer Father        lung cancer, prostate cancer  . Parkinson's disease Father   . Cancer Brother        esophageal cancer  . Diabetes Brother   . Heart failure Sister   . Fibromyalgia Sister   . COPD Sister   . Alzheimer's disease Brother   . Healthy Son   . Healthy Son   . Healthy Son   . Amblyopia Neg Hx   . Blindness Neg Hx   . Cataracts Neg Hx   . Glaucoma Neg Hx   . Macular degeneration Neg Hx   . Retinal detachment Neg Hx   . Strabismus Neg Hx   . Retinitis pigmentosa Neg Hx     SOCIAL HISTORY Social History  Substance Use Topics  . Smoking status: Never Smoker  . Smokeless tobacco: Never Used  . Alcohol use No         OPHTHALMIC EXAM:  Base Eye Exam    Visual Acuity (Snellen - Linear)      Right Left   Dist [place] 20/30 20/50   Dist ph [place] 20/25 20/40       Tonometry (Tonopen, 8:38 AM)      Right Left   Pressure 12 12       Pupils      Dark Light Shape React APD   Right 4 3 Round Brisk None   Left 4 3 Round Brisk None       Visual Fields (Counting fingers)      Left Right    Full Full       Extraocular Movement      Right Left    Full, Ortho Full, Ortho       Neuro/Psych    Oriented x3:  Yes   Mood/Affect:  Normal       Dilation    Both eyes:  1.0% Mydriacyl, 2.5% Phenylephrine @ 8:38 AM        Slit Lamp and Fundus Exam    Slit Lamp Exam      Right Left   Lids/Lashes Dermatochalasis - upper lid, Meibomian gland dysfunction, papillomas Dermatochalasis - upper lid, Meibomian gland dysfunction, papillomas   Conjunctiva/Sclera White and quiet White and quiet   Cornea Clear, mild arcus Clear, mild arcus   Anterior Chamber Deep and quiet Deep and quiet   Iris Round and dilated Round and dilated   Lens 2+ Nuclear sclerosis,  1+ Cortical cataract 2+ Nuclear sclerosis, 1+ Cortical cataract   Vitreous Vitreous syneresis, Posterior vitreous detachment Vitreous syneresis, Posterior vitreous detachment with floaters       Fundus Exam      Right Left   Disc Normal Normal   C/D Ratio 0.4 0.25  Macula Mild Retinal pigment epithelial mottling, No heme or edema good foveal reflex, ERM, Retinal pigment epithelial mottling, no heme or edema   Vessels Mild Vascular attenuation, trace AV crossing changes Mild Vascular attenuation, trace AV crossing changes   Periphery attached, impending/incomplete operculum at 0930 operculated hole at 0200 with surrounding heme, Horseshoe tear at 1030 with light, early laser changes posteriorly, VR tuft at 0730 ora        Refraction    Manifest Refraction      Sphere Cylinder Dist [facility]   Right [place]  20/25   Left +1.00 Sphere 20/25+1          IMAGING AND PROCEDURES  Imaging and Procedures for 10/17/17  OCT, Retina - OU - Both Eyes     Right Eye Quality was good. Central Foveal Thickness: 284. Progression has no prior data. Findings include normal foveal contour, no IRF, no SRF.   Left Eye Quality was good. Central Foveal Thickness: 294. Progression has no prior data. Findings include normal foveal contour, no IRF, no SRF.   Notes Images taken, stored on drive  Diagnosis / Impression: NFP, No IRF, No SRF  Clinical management:  See below  Abbreviations: NFP - Normal foveal profile. CME - cystoid macular edema. PED - pigment epithelial detachment. IRF - intraretinal fluid. SRF - subretinal fluid. EZ - ellipsoid zone. ERM - epiretinal membrane. ORA - outer retinal atrophy. ORT - outer retinal tubulation. SRHM - subretinal hyper-reflective material       Repair Retinal Detach, Photocoag - OS - Left Eye     LASER PROCEDURE NOTE  Procedure:  Barrier laser retinopexy using laser indirect ophthalmoscope, left eye (CPT [id number])  Diagnosis:   Retinal tear w/ subretinal fluid,  left eye                     Flap tear w/ subretinal fluid cuff at 1030 o'clock anterior to equator                         Operculated retinal hole without subretinal fluid 0200 equator  Surgeon: Bernarda Caffey, MD, PhD  Anesthesia: Topical  Informed consent obtained, operative eye marked, and time out performed prior to initiation of laser.   Laser settings:  Lumenis [id number] laser indirect ophthalmoscope Power: 200-300 mW Duration: 100-150 msec  # spots: 803  Placement of laser: Laser was placed in three confluent rows around horseshoe tear and cuff of SRF at 1030 oclock anterior to equator with additional rows anteriorly. Laser was placed in 3 confluent rows around 0200 operculated hole.  Complications: None.  Patient tolerated the procedure well and received written and verbal post-procedure care information/education.               ASSESSMENT/PLAN:    ICD-10-CM   1. Horseshoe tear of retina, left H33.312 Repair Retinal Detach, Photocoag - OS - Left Eye  2. 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 Retinal edema H35.81 OCT, Retina - OU - Both Eyes  4. Nuclear sclerosis of both eyes H25.13     1. Horseshoe tear of retina with localized retinal detachment / subretinal fluid, OS  - s/p incomplete laser retinopexy OS last week with Dr. Posey Pronto - The incidence, risk factors, and natural history of retinal tear was discussed with patient.   - Potential treatment options including laser retinopexy and cryotherapy discussed with patient. - horseshoe tear located superonasal at 1030 -  recommend laser retinopexy OS to complete treatment - RBA of procedure discussed, questions answered - informed consent obtained and signed - see procedure note - start PF QID OS x7 days - f/u in 1-2 wks  2. Operculated hole without detachment OU - OD - incomplete operculum located at 0930 - OS - operculated hole at 0200 equator - recommend laser retinopexy OU -- but  recommend OS today along with treatment for HST above - will laser OD in 2 wks - Reviewed s/s of RT/RD - Strict return precautions for any such RT/RD signs/symptoms  3. No retinal edema  4. Nuclear sclerosis OU - The symptoms of cataract, surgical options, and treatments and risks were discussed with patient. - discussed diagnosis and progression - not yet visually significant - monitor for now   Ophthalmic Meds Ordered this visit:  Meds ordered this encounter  Medications  . prednisoLONE acetate (PRED FORTE) 1 % ophthalmic suspension    Sig: Place 1 drop into the left eye 4 (four) times daily.    Dispense:  10 mL    Refill:  0       Return in about 2 weeks (around 10/31/2017) for POV, Laser retinopexy OD.  There are no Patient Instructions on file for this visit.   Explained the diagnoses, plan, and follow up with the patient and they expressed understanding.  Patient expressed understanding of the importance of proper follow up care.   Gardiner Sleeper, M.D., Ph.D. Diseases & Surgery of the Retina and Vitreous [facility] 10/17/17     Abbreviations: M myopia (nearsighted); A astigmatism; H hyperopia (farsighted); P presbyopia; Mrx spectacle prescription;  CTL contact lenses; OD right eye; OS left eye; OU both eyes  XT exotropia; ET esotropia; PEK punctate epithelial keratitis; PEE punctate epithelial erosions; DES dry eye syndrome; MGD meibomian gland dysfunction; ATs artificial tears; PFAT's preservative free artificial tears; [place] nuclear sclerotic cataract; PSC posterior subcapsular cataract; ERM epi-retinal membrane; PVD posterior vitreous detachment; RD retinal detachment; DM diabetes mellitus; DR diabetic retinopathy; NPDR non-proliferative diabetic retinopathy; PDR proliferative diabetic retinopathy; CSME clinically significant macular edema; DME diabetic macular edema; dbh dot blot hemorrhages; CWS cotton wool spot; POAG primary open angle  glaucoma; C/D cup-to-disc ratio; HVF humphrey visual field; GVF goldmann visual field; OCT optical coherence tomography; IOP intraocular pressure; BRVO Branch retinal vein occlusion; CRVO central retinal vein occlusion; CRAO central retinal artery occlusion; BRAO branch retinal artery occlusion; RT retinal tear; SB scleral buckle; PPV pars plana vitrectomy; VH Vitreous hemorrhage; PRP panretinal laser photocoagulation; IVK intravitreal kenalog; VMT vitreomacular traction; MH Macular hole;  NVD neovascularization of the disc; NVE neovascularization elsewhere; AREDS age related eye disease study; ARMD age related macular degeneration; POAG primary open angle glaucoma; EBMD epithelial/anterior basement membrane dystrophy; ACIOL anterior chamber intraocular lens; IOL intraocular lens; PCIOL posterior chamber intraocular lens; Phaco/IOL phacoemulsification with intraocular lens placement; [place] photorefractive keratectomy; LASIK laser assisted in situ keratomileusis; HTN hypertension; DM diabetes mellitus; COPD chronic obstructive pulmonary disease

## 2017-10-17 ENCOUNTER — Ambulatory Visit (INDEPENDENT_AMBULATORY_CARE_PROVIDER_SITE_OTHER): Payer: PPO | Admitting: Ophthalmology

## 2017-10-17 ENCOUNTER — Encounter (INDEPENDENT_AMBULATORY_CARE_PROVIDER_SITE_OTHER): Payer: Self-pay | Admitting: Ophthalmology

## 2017-10-17 DIAGNOSIS — H33313 Horseshoe tear of retina without detachment, bilateral: Secondary | ICD-10-CM

## 2017-10-17 DIAGNOSIS — H2513 Age-related nuclear cataract, bilateral: Secondary | ICD-10-CM | POA: Diagnosis not present

## 2017-10-17 DIAGNOSIS — H33312 Horseshoe tear of retina without detachment, left eye: Secondary | ICD-10-CM | POA: Diagnosis not present

## 2017-10-17 DIAGNOSIS — H33303 Unspecified retinal break, bilateral: Secondary | ICD-10-CM

## 2017-10-17 DIAGNOSIS — H3581 Retinal edema: Secondary | ICD-10-CM | POA: Diagnosis not present

## 2017-10-17 MED ORDER — PREDNISOLONE ACETATE 1 % OP SUSP: 1.0000 [drp] | Freq: Four times a day (QID) | OPHTHALMIC | 0 refills | Status: AC

## 2017-10-25 NOTE — Progress Notes (Signed)
[facility] Note  10/26/2017     CHIEF COMPLAINT Patient presents for Retina Follow Up   HISTORY OF PRESENT ILLNESS: Jason Franco is a 66 y.o. male who presents to the clinic today for:   HPI    Retina Follow Up    In left eye.  Severity is mild.  Since onset it is rapidly improving.          Comments    POV #1 Laser Retinopexy OS 10/17/17 . Laser retinopexy OD today Patient states he has "  very few floaters couple times a day" Os. OD more often. Completed Pred forte gtts Qid Os. He states gtts "helped a lot" Denies vits        Last edited by Zenovia Jordan, LPN on 34/12/9377  0:24 AM. (History)      Referring physician: Lajean Manes, MD [address] [place], [place] [address]  HISTORICAL INFORMATION:   Selected notes from the MEDICAL RECORD NUMBER Referral from Dr. Zenia Resides for retina eval Pt with history of HST and operculated hole OS noted 10.22.18, was emergently referred to Dr. Posey Pronto who performed laser retinopexy OS 10.22.18 Ocular Hx- cataract OU; PVD OU;  PMH- HTN; hypercholesterolemia;    CURRENT MEDICATIONS: Current Outpatient Medications (Ophthalmic Drugs)  Medication Sig  . prednisoLONE acetate (PRED FORTE) 1 % ophthalmic suspension Place 1 drop into the left eye 4 (four) times daily. (Patient not taking: Reported on 10/26/2017)   No current facility-administered medications for this visit.  (Ophthalmic Drugs)   Current Outpatient Medications (Other)  Medication Sig  . ANDRODERM 2 MG/24HR PT24 Place 1 patch onto the skin daily.  . bisoprolol (ZEBETA) 10 MG tablet Take 1 tablet by mouth every evening.  Marland Kitchen FLUoxetine (PROZAC) 20 MG capsule Take 20 mg by mouth every evening.   . lansoprazole (PREVACID) 15 MG capsule Take 15 mg by mouth every evening.   . traZODone (DESYREL) 50 MG tablet Take 50 mg by mouth at bedtime as needed for sleep.   No current facility-administered medications for this visit.  (Other)       REVIEW OF SYSTEMS: ROS    Positive for: Cardiovascular, Eyes   Negative for: Constitutional, Gastrointestinal, Neurological, Skin, Genitourinary, Musculoskeletal, HENT, Endocrine, Respiratory, Psychiatric, Allergic/Imm, Heme/Lymph   Last edited by Zenovia Jordan, LPN on 32/08/9241  6:83 AM. (History)       ALLERGIES Allergies  Allergen Reactions  . Atorvastatin Other (See Comments)    Muscle pain  . Rosuvastatin Calcium Other (See Comments)    Muscle pain  . Simvastatin Other (See Comments)    Muscle pain  . Tamsulosin Hcl Other (See Comments)    Lightheadedness     PAST MEDICAL HISTORY Past Medical History:  Diagnosis Date  . ADD (attention deficit disorder)    mild  . Depression   . Hyperglycemia   . Hyperlipidemia   . Hypertension   . Inguinal hernia    right; only felt with coughing  . Peripheral neuropathy    Past Surgical History:  Procedure Laterality Date  . CHOLECYSTECTOMY    . colonscopy  5 yrs ago  . EYE SURGERY    . RETINAL LASER PROCEDURE     OS    FAMILY HISTORY Family History  Problem Relation Age of Onset  . Heart failure Mother   . Alzheimer's disease Mother   . Cancer Father        lung cancer, prostate cancer  .  Parkinson's disease Father   . Cancer Brother        esophageal cancer  . Diabetes Brother   . Heart failure Sister   . Fibromyalgia Sister   . COPD Sister   . Alzheimer's disease Brother   . Healthy Son   . Healthy Son   . Healthy Son   . Amblyopia Neg Hx   . Blindness Neg Hx   . Cataracts Neg Hx   . Glaucoma Neg Hx   . Macular degeneration Neg Hx   . Retinal detachment Neg Hx   . Strabismus Neg Hx   . Retinitis pigmentosa Neg Hx     SOCIAL HISTORY Social History   Tobacco Use  . Smoking status: Never Smoker  . Smokeless tobacco: Never Used  Substance Use Topics  . Alcohol use: No  . Drug use: No         OPHTHALMIC EXAM:  Base Eye Exam    Visual Acuity (Snellen - Linear)      Right Left    Dist [place] 20/25 20/40   Dist ph [place] 20/20 20/30       Tonometry (Tonopen, 8:47 AM)      Right Left   Pressure 12 10       Pupils      Dark Light Shape React APD   Right 3 2.5 Round Minimal None   Left 4 3.5 Round Minimal None       Visual Fields (Counting fingers)      Left Right    Full Full       Extraocular Movement      Right Left    Full, Ortho Full, Ortho       Neuro/Psych    Oriented x3:  Yes   Mood/Affect:  Normal       Dilation    Both eyes:  1.0% Mydriacyl, 2.5% Phenylephrine @ 8:47 AM        Slit Lamp and Fundus Exam    Slit Lamp Exam      Right Left   Lids/Lashes Dermatochalasis - upper lid, Meibomian gland dysfunction, papillomas Dermatochalasis - upper lid, Meibomian gland dysfunction, papillomas   Conjunctiva/Sclera White and quiet White and quiet   Cornea Clear, mild arcus Clear, mild arcus   Anterior Chamber Deep and quiet Deep and quiet   Iris Round and dilated Round and dilated   Lens 2+ Nuclear sclerosis, 1+ Cortical cataract 2+ Nuclear sclerosis, 1+ Cortical cataract   Vitreous Vitreous syneresis, Posterior vitreous detachment Vitreous syneresis, Posterior vitreous detachment with floaters       Fundus Exam      Right Left   Disc Normal Normal   C/D Ratio 0.4 0.25   Macula Mild Retinal pigment epithelial mottling, No heme or edema good foveal reflex, ERM, Retinal pigment epithelial mottling, no heme or edema   Vessels Mild Vascular attenuation, trace AV crossing changes Mild Vascular attenuation, trace AV crossing changes   Periphery attached, impending/incomplete operculum at 0930 operculated hole at 0200 with surrounding heme-good laser surrounding, Horseshoe tear at 1030 with good laser surrounding, VR tuft at 0730 ora          IMAGING AND PROCEDURES  Imaging and Procedures for 10/26/17   LASER PROCEDURE NOTE  Procedure:  Touch-up barrier laser retinopexy using laser indirect ophthalmoscope, LEFT eye   Diagnosis:   Retinal tear,  LEFT eye  Flap tear w/ SRF at 1030 o'clock anterior to equator   Surgeon: Bernarda Caffey, MD, PhD  Anesthesia: Topical  Informed consent obtained, operative eye marked, and time out performed prior to initiation of laser.   Laser settings:  Lumenis [id number] laser indirect ophthalmoscope Power: 250 mW Duration: 150 msec  # spots: 202  Placement of laser: Touch up laser was placed along anterior horns of the flap tear at 1030 oclock with additional rows to the ora serrata.  Complications: None.  Patient tolerated the procedure well and received written and verbal post-procedure care information/education.          ASSESSMENT/PLAN:    ICD-10-CM   1. Horseshoe tear of retina, left H33.312   2. 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 Nuclear sclerosis of both eyes H25.13     1. Horseshoe tear of retina with localized retinal detachment / subretinal fluid, OS  - s/p laser retinopexy OS (10.31.18) bz  - s/p incomplete laser retinopexy OS last week (of 10.15.18) with Dr. Posey Pronto - recommend touch up laser retinopexy OS anteriorly to reinforce treatment - RBA of procedure discussed, questions answered - informed consent obtained and signed - see procedure note - start PF QID OS x7 days - f/u on 11.21.18 for laser retinopexy OD  2. Operculated hole without detachment OU - OD - incomplete operculum located at 0930 - OS - operculated hole at 0200 equator - s/p laser retinopexy OS 10.31.18 -- good laser treatment in place - will laser OD in 2 wks - Reviewed s/s of RT/RD - Strict return precautions for any such RT/RD signs/symptoms  3. Nuclear sclerosis OU - The symptoms of cataract, surgical options, and treatments and risks were discussed with patient. - discussed diagnosis and progression - not yet visually significant - monitor for now   Ophthalmic Meds Ordered this visit:  No orders of the defined types were placed in this  encounter.      Return in 12 days (on 11/07/2017) for For laser retinopexy OD.  There are no Patient Instructions on file for this visit.   Explained the diagnoses, plan, and follow up with the patient and they expressed understanding.  Patient expressed understanding of the importance of proper follow up care.   Gardiner Sleeper, M.D., Ph.D. Diseases & Surgery of the Retina and Vitreous [facility] 10/26/17     Abbreviations: M myopia (nearsighted); A astigmatism; H hyperopia (farsighted); P presbyopia; Mrx spectacle prescription;  CTL contact lenses; OD right eye; OS left eye; OU both eyes  XT exotropia; ET esotropia; PEK punctate epithelial keratitis; PEE punctate epithelial erosions; DES dry eye syndrome; MGD meibomian gland dysfunction; ATs artificial tears; PFAT's preservative free artificial tears; [place] nuclear sclerotic cataract; PSC posterior subcapsular cataract; ERM epi-retinal membrane; PVD posterior vitreous detachment; RD retinal detachment; DM diabetes mellitus; DR diabetic retinopathy; NPDR non-proliferative diabetic retinopathy; PDR proliferative diabetic retinopathy; CSME clinically significant macular edema; DME diabetic macular edema; dbh dot blot hemorrhages; CWS cotton wool spot; POAG primary open angle glaucoma; C/D cup-to-disc ratio; HVF humphrey visual field; GVF goldmann visual field; OCT optical coherence tomography; IOP intraocular pressure; BRVO Branch retinal vein occlusion; CRVO central retinal vein occlusion; CRAO central retinal artery occlusion; BRAO branch retinal artery occlusion; RT retinal tear; SB scleral buckle; PPV pars plana vitrectomy; VH Vitreous hemorrhage; PRP panretinal laser photocoagulation; IVK intravitreal kenalog; VMT vitreomacular traction; MH Macular hole;  NVD neovascularization of the disc; NVE neovascularization elsewhere; AREDS  age related eye disease study; ARMD age related macular degeneration; POAG primary open  angle glaucoma; EBMD epithelial/anterior basement membrane dystrophy; ACIOL anterior chamber intraocular lens; IOL intraocular lens; PCIOL posterior chamber intraocular lens; Phaco/IOL phacoemulsification with intraocular lens placement; [place] photorefractive keratectomy; LASIK laser assisted in situ keratomileusis; HTN hypertension; DM diabetes mellitus; COPD chronic obstructive pulmonary disease

## 2017-10-26 ENCOUNTER — Encounter (INDEPENDENT_AMBULATORY_CARE_PROVIDER_SITE_OTHER): Payer: Self-pay | Admitting: Ophthalmology

## 2017-10-26 ENCOUNTER — Ambulatory Visit (INDEPENDENT_AMBULATORY_CARE_PROVIDER_SITE_OTHER): Payer: PPO | Admitting: Ophthalmology

## 2017-10-26 DIAGNOSIS — H33303 Unspecified retinal break, bilateral: Secondary | ICD-10-CM

## 2017-10-26 DIAGNOSIS — H33313 Horseshoe tear of retina without detachment, bilateral: Secondary | ICD-10-CM

## 2017-10-26 DIAGNOSIS — H2513 Age-related nuclear cataract, bilateral: Secondary | ICD-10-CM

## 2017-10-26 DIAGNOSIS — H33312 Horseshoe tear of retina without detachment, left eye: Secondary | ICD-10-CM

## 2017-10-30 ENCOUNTER — Encounter (INDEPENDENT_AMBULATORY_CARE_PROVIDER_SITE_OTHER): Payer: PPO | Admitting: Ophthalmology

## 2017-11-06 NOTE — Progress Notes (Signed)
[facility] Note  11/07/2017     CHIEF COMPLAINT Patient presents for Retina Follow Up   HISTORY OF PRESENT ILLNESS: Jason Franco is a 66 y.o. male who presents to the clinic today for:   HPI    Retina Follow Up    In left eye.  Severity is mild.  Since onset it is gradually improving.  I, the attending physician,  performed the HPI with the patient and updated documentation appropriately.          Comments    S/P Laser retinopexy OS 10/17/17./10/26/17 Patient states he hasn't noticed any floaters in the last few days. "MY VA is starting to improve."He has completed the pred forte  and denies any other gtts.Denies Vits         Last edited by Bernarda Caffey, MD on 11/07/2017  9:43 AM. (History)      Referring physician: Lajean Manes, MD [address] [place], [place] [address]  HISTORICAL INFORMATION:   Selected notes from the MEDICAL RECORD NUMBER Referral from Dr. Zenia Resides for retina eval Pt with history of HST and operculated hole OS noted 10.22.18, was emergently referred to Dr. Posey Pronto who performed laser retinopexy OS 10.22.18 Ocular Hx- cataract OU; PVD OU;  PMH- HTN; hypercholesterolemia;    CURRENT MEDICATIONS: Current Outpatient Medications (Ophthalmic Drugs)  Medication Sig  . prednisoLONE acetate (PRED FORTE) 1 % ophthalmic suspension Place 1 drop into the left eye 4 (four) times daily. (Patient not taking: Reported on 10/26/2017)   No current facility-administered medications for this visit.  (Ophthalmic Drugs)   Current Outpatient Medications (Other)  Medication Sig  . ANDRODERM 2 MG/24HR PT24 Place 1 patch onto the skin daily.  . bisoprolol (ZEBETA) 10 MG tablet Take 1 tablet by mouth every evening.  Marland Kitchen FLUoxetine (PROZAC) 20 MG capsule Take 20 mg by mouth every evening.   . lansoprazole (PREVACID) 15 MG capsule Take 15 mg by mouth every evening.   . traZODone (DESYREL) 50 MG tablet Take 50 mg by mouth at bedtime  as needed for sleep.   No current facility-administered medications for this visit.  (Other)      REVIEW OF SYSTEMS: ROS    Positive for: Eyes   Negative for: Constitutional, Gastrointestinal, Neurological, Skin, Genitourinary, Musculoskeletal, HENT, Endocrine, Cardiovascular, Respiratory, Psychiatric, Allergic/Imm, Heme/Lymph   Last edited by Zenovia Jordan, LPN on 65/78/4696  2:95 AM. (History)       ALLERGIES Allergies  Allergen Reactions  . Atorvastatin Other (See Comments)    Muscle pain  . Rosuvastatin Calcium Other (See Comments)    Muscle pain  . Simvastatin Other (See Comments)    Muscle pain  . Tamsulosin Hcl Other (See Comments)    Lightheadedness     PAST MEDICAL HISTORY Past Medical History:  Diagnosis Date  . ADD (attention deficit disorder)    mild  . Depression   . Hyperglycemia   . Hyperlipidemia   . Hypertension   . Inguinal hernia    right; only felt with coughing  . Peripheral neuropathy    Past Surgical History:  Procedure Laterality Date  . CHOLECYSTECTOMY    . COLONOSCOPY WITH PROPOFOL N/A 03/20/2017   Procedure: COLONOSCOPY WITH PROPOFOL;  Surgeon: Garlan Fair, MD;  Location: [facility];  Service: Endoscopy;  Laterality: N/A;  . colonscopy  5 yrs ago  . ESOPHAGOGASTRODUODENOSCOPY (EGD) WITH PROPOFOL N/A 03/20/2017   Procedure: ESOPHAGOGASTRODUODENOSCOPY (EGD) WITH PROPOFOL;  Surgeon: Ursula Alert  Wynetta Emery, MD;  Location: [facility];  Service: Endoscopy;  Laterality: N/A;  . EYE SURGERY    . RETINAL LASER PROCEDURE     OS    FAMILY HISTORY Family History  Problem Relation Age of Onset  . Heart failure Mother   . Alzheimer's disease Mother   . Cancer Father        lung cancer, prostate cancer  . Parkinson's disease Father   . Cancer Brother        esophageal cancer  . Diabetes Brother   . Heart failure Sister   . Fibromyalgia Sister   . COPD Sister   . Alzheimer's disease Brother   . Healthy Son   . Healthy Son   .  Healthy Son   . Amblyopia Neg Hx   . Blindness Neg Hx   . Cataracts Neg Hx   . Glaucoma Neg Hx   . Macular degeneration Neg Hx   . Retinal detachment Neg Hx   . Strabismus Neg Hx   . Retinitis pigmentosa Neg Hx     SOCIAL HISTORY Social History   Tobacco Use  . Smoking status: Never Smoker  . Smokeless tobacco: Never Used  Substance Use Topics  . Alcohol use: No  . Drug use: No         OPHTHALMIC EXAM:  Base Eye Exam    Visual Acuity (Snellen - Linear)      Right Left   Dist [place] 20/25 20/30   Dist ph [place] 20/20 20/25       Tonometry (Tonopen, 9:07 AM)      Right Left   Pressure 13 10       Pupils      Dark Light Shape React APD   Right 4 2 Round Brisk None   Left 4 2 Round Brisk None       Visual Fields (Counting fingers)      Left Right    Full Full       Extraocular Movement      Right Left    Full, Ortho Full, Ortho       Neuro/Psych    Oriented x3:  Yes   Mood/Affect:  Normal       Dilation    Both eyes:  1.0% Mydriacyl, 2.5% Phenylephrine @ 9:07 AM        Slit Lamp and Fundus Exam    Slit Lamp Exam      Right Left   Lids/Lashes Dermatochalasis - upper lid, Meibomian gland dysfunction, papillomas Dermatochalasis - upper lid, Meibomian gland dysfunction, papillomas   Conjunctiva/Sclera White and quiet White and quiet   Cornea Clear, mild arcus Clear, mild arcus   Anterior Chamber Deep and quiet Deep and quiet   Iris Round and dilated Round and dilated   Lens 2+ Nuclear sclerosis, 1+ Cortical cataract 2+ Nuclear sclerosis, 1+ Cortical cataract   Vitreous Vitreous syneresis, Posterior vitreous detachment Vitreous syneresis, Posterior vitreous detachment with floaters       Fundus Exam      Right Left   Disc Normal Normal   C/D Ratio 0.4 0.25   Macula Mild Retinal pigment epithelial mottling, No heme or edema good foveal reflex, ERM, Retinal pigment epithelial mottling, no heme or edema   Vessels Mild Vascular attenuation, trace AV  crossing changes Mild Vascular attenuation, trace AV crossing changes   Periphery attached, tear / impending/incomplete operculum w/ posterior pigment at 0930--no SRF; VR tuft at 1000 ant to equator operculated hole  at 0200 with surrounding heme-good laser surrounding, Horseshoe tear at 1030 with good laser surrounding, VR tuft at 0730 ora          IMAGING AND PROCEDURES  Imaging and Procedures for 11/07/17  Repair Retinal Breaks, Laser - OD - Right Eye     LASER PROCEDURE NOTE  Procedure:  Barrier laser retinopexy using laser indirect ophthalmoscope, RIGHT eye   Diagnosis:   Retinal tear, RIGHT eye                     Flap tear at 930 o'clock posterior to ora                         VR tuft at 0900 ant to equator   Surgeon: Bernarda Caffey, MD, PhD  Anesthesia: Topical  Informed consent obtained, operative eye marked, and time out performed prior to initiation of laser.   Laser settings:  Lumenis [id number] laser indirect ophthalmoscope Power: 340 mW Duration: 70 msec  # spots: 412  Placement of laser: Laser was placed in three confluent rows around tear at 930 posterior to ora and 0900 VR tuft with additional rows anteriorly.  Complications: None.  Patient tolerated the procedure well and received written and verbal post-procedure care information/education.               ASSESSMENT/PLAN:    ICD-10-CM   1. Horseshoe tear of retina, left H33.312   2. 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 Nuclear sclerosis of both eyes H25.13   4. Retinal edema H35.81     1. Horseshoe tear of retina with localized retinal detachment / subretinal fluid, OS  - s/p incomplete laser retinopexy OS last week (of 10.15.18) with Dr. Posey Pronto - s/p laser retinopexy OS (10.31.18; 11.09.18 touch up) bz - laser looks great--stable - completed PF QID OS x7 days - monitor  2. Operculated hole without detachment OU - OS -  operculated hole at 0200 equator - s/p laser retinopexy OS 10.31.18 -- good laser treatment in place - OD - tear / incomplete operculum located at 0930, VR tuft at 1000 - recommend laser retinopexy OD - RBA of procedure discussed, questions answered - informed consent obtained and signed - see procedure note - start PF QID OD x7 days - Reviewed s/s of RT/RD - Strict return precautions for any such RT/RD signs/symptoms  3. Nuclear sclerosis OU - The symptoms of cataract, surgical options, and treatments and risks were discussed with patient. - discussed diagnosis and progression - not yet visually significant - monitor for now   Ophthalmic Meds Ordered this visit:  No orders of the defined types were placed in this encounter.      Return in about 2 weeks (around 11/21/2017) for Dilated Exam, POV.  There are no Patient Instructions on file for this visit.   Explained the diagnoses, plan, and follow up with the patient and they expressed understanding.  Patient expressed understanding of the importance of proper follow up care.   Gardiner Sleeper, M.D., Ph.D. Diseases & Surgery of the Retina and Vitreous [facility] 11/07/17     Abbreviations: M myopia (nearsighted); A astigmatism; H hyperopia (farsighted); P presbyopia; Mrx spectacle prescription;  CTL contact lenses; OD right eye; OS left eye; OU both eyes  XT exotropia; ET esotropia; PEK punctate epithelial keratitis; PEE punctate epithelial erosions;  DES dry eye syndrome; MGD meibomian gland dysfunction; ATs artificial tears; PFAT's preservative free artificial tears; [place] nuclear sclerotic cataract; PSC posterior subcapsular cataract; ERM epi-retinal membrane; PVD posterior vitreous detachment; RD retinal detachment; DM diabetes mellitus; DR diabetic retinopathy; NPDR non-proliferative diabetic retinopathy; PDR proliferative diabetic retinopathy; CSME clinically significant macular edema; DME diabetic  macular edema; dbh dot blot hemorrhages; CWS cotton wool spot; POAG primary open angle glaucoma; C/D cup-to-disc ratio; HVF humphrey visual field; GVF goldmann visual field; OCT optical coherence tomography; IOP intraocular pressure; BRVO Branch retinal vein occlusion; CRVO central retinal vein occlusion; CRAO central retinal artery occlusion; BRAO branch retinal artery occlusion; RT retinal tear; SB scleral buckle; PPV pars plana vitrectomy; VH Vitreous hemorrhage; PRP panretinal laser photocoagulation; IVK intravitreal kenalog; VMT vitreomacular traction; MH Macular hole;  NVD neovascularization of the disc; NVE neovascularization elsewhere; AREDS age related eye disease study; ARMD age related macular degeneration; POAG primary open angle glaucoma; EBMD epithelial/anterior basement membrane dystrophy; ACIOL anterior chamber intraocular lens; IOL intraocular lens; PCIOL posterior chamber intraocular lens; Phaco/IOL phacoemulsification with intraocular lens placement; [place] photorefractive keratectomy; LASIK laser assisted in situ keratomileusis; HTN hypertension; DM diabetes mellitus; COPD chronic obstructive pulmonary disease

## 2017-11-07 ENCOUNTER — Encounter (INDEPENDENT_AMBULATORY_CARE_PROVIDER_SITE_OTHER): Payer: Self-pay | Admitting: Ophthalmology

## 2017-11-07 ENCOUNTER — Ambulatory Visit (INDEPENDENT_AMBULATORY_CARE_PROVIDER_SITE_OTHER): Payer: PPO | Admitting: Ophthalmology

## 2017-11-07 DIAGNOSIS — H33313 Horseshoe tear of retina without detachment, bilateral: Secondary | ICD-10-CM | POA: Diagnosis not present

## 2017-11-07 DIAGNOSIS — H2513 Age-related nuclear cataract, bilateral: Secondary | ICD-10-CM | POA: Diagnosis not present

## 2017-11-07 DIAGNOSIS — H33312 Horseshoe tear of retina without detachment, left eye: Secondary | ICD-10-CM | POA: Diagnosis not present

## 2017-11-07 DIAGNOSIS — H3581 Retinal edema: Secondary | ICD-10-CM

## 2017-11-07 DIAGNOSIS — H33303 Unspecified retinal break, bilateral: Secondary | ICD-10-CM

## 2017-11-20 NOTE — Progress Notes (Deleted)
[facility] Note  11/21/2017     CHIEF COMPLAINT Patient presents for No chief complaint on file.   HISTORY OF PRESENT ILLNESS: Jason Franco is a 66 y.o. male who presents to the clinic today for:     Referring physician: Lajean Manes, [place]. [address] [place], [place] [address]  HISTORICAL INFORMATION:   Selected notes from the MEDICAL RECORD NUMBER Referral from Dr. Zenia Resides for retina eval Pt with history of HST and operculated hole OS noted 10.22.18, was emergently referred to Dr. Posey Pronto who performed laser retinopexy OS 10.22.18 Ocular Hx- cataract OU; PVD OU;  PMH- HTN; hypercholesterolemia;    CURRENT MEDICATIONS: Current Outpatient Medications (Ophthalmic Drugs)  Medication Sig   prednisoLONE acetate (PRED FORTE) 1 % ophthalmic suspension Place 1 drop into the left eye 4 (four) times daily. (Patient not taking: Reported on 10/26/2017)   No current facility-administered medications for this visit.  (Ophthalmic Drugs)   Current Outpatient Medications (Other)  Medication Sig   ANDRODERM 2 MG/24HR PT24 Place 1 patch onto the skin daily.   bisoprolol (ZEBETA) 10 MG tablet Take 1 tablet by mouth every evening.   FLUoxetine (PROZAC) 20 MG capsule Take 20 mg by mouth every evening.    lansoprazole (PREVACID) 15 MG capsule Take 15 mg by mouth every evening.    traZODone (DESYREL) 50 MG tablet Take 50 mg by mouth at bedtime as needed for sleep.   No current facility-administered medications for this visit.  (Other)      REVIEW OF SYSTEMS:    ALLERGIES Allergies  Allergen Reactions   Atorvastatin Other (See Comments)    Muscle pain   Rosuvastatin Calcium Other (See Comments)    Muscle pain   Simvastatin Other (See Comments)    Muscle pain   Tamsulosin Hcl Other (See Comments)    Lightheadedness     PAST MEDICAL HISTORY Past Medical History:  Diagnosis Date   ADD (attention deficit disorder)    mild    Depression    Hyperglycemia    Hyperlipidemia    Hypertension    Inguinal hernia    right; only felt with coughing   Peripheral neuropathy    Past Surgical History:  Procedure Laterality Date   CHOLECYSTECTOMY     COLONOSCOPY WITH PROPOFOL N/A 03/20/2017   Procedure: COLONOSCOPY WITH PROPOFOL;  Surgeon: Garlan Fair, MD;  Location: [facility];  Service: Endoscopy;  Laterality: N/A;   colonscopy  5 yrs ago   ESOPHAGOGASTRODUODENOSCOPY (EGD) WITH PROPOFOL N/A 03/20/2017   Procedure: ESOPHAGOGASTRODUODENOSCOPY (EGD) WITH PROPOFOL;  Surgeon: Garlan Fair, MD;  Location: [facility];  Service: Endoscopy;  Laterality: N/A;   EYE SURGERY     RETINAL LASER PROCEDURE     OS    FAMILY HISTORY Family History  Problem Relation Age of Onset   Heart failure Mother    Alzheimer's disease Mother    Cancer Father        lung cancer, prostate cancer   Parkinson's disease Father    Cancer Brother        esophageal cancer   Diabetes Brother    Heart failure Sister    Fibromyalgia Sister    COPD Sister    Alzheimer's disease Brother    Healthy Son    Healthy Son    Healthy Son    Amblyopia Neg Hx    Blindness Neg Hx    Cataracts Neg Hx    Glaucoma  Neg Hx    Macular degeneration Neg Hx    Retinal detachment Neg Hx    Strabismus Neg Hx    Retinitis pigmentosa Neg Hx     SOCIAL HISTORY Social History   Tobacco Use   Smoking status: Never Smoker   Smokeless tobacco: Never Used  Substance Use Topics   Alcohol use: No   Drug use: No         OPHTHALMIC EXAM:   Not recorded      IMAGING AND PROCEDURES  Imaging and Procedures for 11/20/17         ASSESSMENT/PLAN:    ICD-10-CM   1. Horseshoe tear of retina, left H33.312   2. 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 Nuclear sclerosis of both eyes H25.13   4. Retinal edema H35.81     1. Horseshoe tear of retina with localized retinal detachment  / subretinal fluid, OS  - s/p incomplete laser retinopexy OS last week (of 10.15.18) with Dr. Posey Pronto - s/p laser retinopexy OS (10.31.18; 11.09.18 touch up) bz - laser looks great--stable - completed PF QID OS x7 days - monitor  2. Operculated hole without detachment OU - OS - operculated hole at 0200 equator - s/p laser retinopexy OS 10.31.18 -- good laser treatment in place - OD - tear / incomplete operculum located at 0930, VR tuft at 1000 - recommend laser retinopexy OD - RBA of procedure discussed, questions answered - informed consent obtained and signed - see procedure note - start PF QID OD x7 days - Reviewed s/s of RT/RD - Strict return precautions for any such RT/RD signs/symptoms  3. Nuclear sclerosis OU - The symptoms of cataract, surgical options, and treatments and risks were discussed with patient. - discussed diagnosis and progression - not yet visually significant - monitor for now   Ophthalmic Meds Ordered this visit:  No orders of the defined types were placed in this encounter.      No Follow-up on file.  There are no Patient Instructions on file for this visit.   Explained the diagnoses, plan, and follow up with the patient and they expressed understanding.  Patient expressed understanding of the importance of proper follow up care.   This document serves as a record of services personally performed by Gardiner Sleeper, MD, PhD. It was created on their behalf by Catha Brow, [place], a certified ophthalmic assistant. The creation of this record is the provider's dictation and/or activities during the visit.  Electronically signed by: Catha Brow, COA  11/20/17 9:22 AM    Gardiner Sleeper, M.D., Ph.D. Diseases & Surgery of the Retina and [place] 11/20/17     Abbreviations: M myopia (nearsighted); A astigmatism; H hyperopia (farsighted); P presbyopia; Mrx spectacle prescription;  CTL contact lenses; OD right  eye; OS left eye; OU both eyes  XT exotropia; ET esotropia; PEK punctate epithelial keratitis; PEE punctate epithelial erosions; DES dry eye syndrome; MGD meibomian gland dysfunction; ATs artificial tears; PFAT's preservative free artificial tears; [place] nuclear sclerotic cataract; PSC posterior subcapsular cataract; ERM epi-retinal membrane; PVD posterior vitreous detachment; RD retinal detachment; DM diabetes mellitus; DR diabetic retinopathy; NPDR non-proliferative diabetic retinopathy; PDR proliferative diabetic retinopathy; CSME clinically significant macular edema; DME diabetic macular edema; dbh dot blot hemorrhages; CWS cotton wool spot; POAG primary open angle glaucoma; C/D cup-to-disc ratio; HVF humphrey visual field; GVF goldmann visual field; OCT optical coherence tomography; IOP intraocular pressure; BRVO Branch retinal  vein occlusion; CRVO central retinal vein occlusion; CRAO central retinal artery occlusion; BRAO branch retinal artery occlusion; RT retinal tear; SB scleral buckle; PPV pars plana vitrectomy; VH Vitreous hemorrhage; PRP panretinal laser photocoagulation; IVK intravitreal kenalog; VMT vitreomacular traction; MH Macular hole;  NVD neovascularization of the disc; NVE neovascularization elsewhere; AREDS age related eye disease study; ARMD age related macular degeneration; POAG primary open angle glaucoma; EBMD epithelial/anterior basement membrane dystrophy; ACIOL anterior chamber intraocular lens; IOL intraocular lens; PCIOL posterior chamber intraocular lens; Phaco/IOL phacoemulsification with intraocular lens placement; [place] photorefractive keratectomy; LASIK laser assisted in situ keratomileusis; HTN hypertension; DM diabetes mellitus; COPD chronic obstructive pulmonary disease

## 2017-11-21 ENCOUNTER — Encounter (INDEPENDENT_AMBULATORY_CARE_PROVIDER_SITE_OTHER): Payer: PPO | Admitting: Ophthalmology

## 2017-12-05 DIAGNOSIS — G609 Hereditary and idiopathic neuropathy, unspecified: Secondary | ICD-10-CM | POA: Diagnosis not present

## 2017-12-05 DIAGNOSIS — E7800 Pure hypercholesterolemia, unspecified: Secondary | ICD-10-CM | POA: Diagnosis not present

## 2017-12-05 DIAGNOSIS — I10 Essential (primary) hypertension: Secondary | ICD-10-CM | POA: Diagnosis not present

## 2017-12-05 DIAGNOSIS — E291 Testicular hypofunction: Secondary | ICD-10-CM | POA: Diagnosis not present

## 2017-12-05 DIAGNOSIS — Z79899 Other long term (current) drug therapy: Secondary | ICD-10-CM | POA: Diagnosis not present

## 2017-12-05 DIAGNOSIS — Z23 Encounter for immunization: Secondary | ICD-10-CM | POA: Diagnosis not present

## 2017-12-05 DIAGNOSIS — Z0000 Encounter for general adult medical examination without abnormal findings: Secondary | ICD-10-CM | POA: Diagnosis not present

## 2017-12-05 DIAGNOSIS — E669 Obesity, unspecified: Secondary | ICD-10-CM | POA: Diagnosis not present

## 2017-12-05 DIAGNOSIS — F325 Major depressive disorder, single episode, in full remission: Secondary | ICD-10-CM | POA: Diagnosis not present

## 2017-12-05 DIAGNOSIS — Z6832 Body mass index (BMI) 32.0-32.9, adult: Secondary | ICD-10-CM | POA: Diagnosis not present

## 2018-03-30 ENCOUNTER — Emergency Department (INDEPENDENT_AMBULATORY_CARE_PROVIDER_SITE_OTHER)
Admission: EM | Admit: 2018-03-30 | Discharge: 2018-03-30 | Disposition: A | Discharge status: Home / Self Care | Payer: PPO | Source: Home / Self Care | Attending: Emergency Medicine | Admitting: Emergency Medicine

## 2018-03-30 ENCOUNTER — Other Ambulatory Visit: Payer: Self-pay

## 2018-03-30 ENCOUNTER — Emergency Department (INDEPENDENT_AMBULATORY_CARE_PROVIDER_SITE_OTHER): Payer: PPO

## 2018-03-30 DIAGNOSIS — J9811 Atelectasis: Secondary | ICD-10-CM | POA: Diagnosis not present

## 2018-03-30 DIAGNOSIS — R05 Cough: Secondary | ICD-10-CM | POA: Diagnosis not present

## 2018-03-30 DIAGNOSIS — J209 Acute bronchitis, unspecified: Secondary | ICD-10-CM

## 2018-03-30 MED ORDER — AZITHROMYCIN 250 MG PO TABS: ORAL_TABLET | ORAL | 0 refills | Status: AC

## 2018-03-30 MED ORDER — BENZONATATE 100 MG PO CAPS
100.0000 mg | ORAL_CAPSULE | Freq: Three times a day (TID) | ORAL | 0 refills | Status: AC | PRN
Start: 2018-03-30 — End: ?

## 2018-03-30 NOTE — ED Provider Notes (Signed)
[facility]    CSN: [id number] Arrival date & time: 03/30/18  1115     History   Chief Complaint Chief Complaint  Patient presents with  . Cough    HPI Jason Franco is a 67 y.o. male.   HPI Patient has had a dry cough for the last 2 weeks.  He has not been ill.  He initially thought this was allergy related.  He feels the cough is coming from his upper chest.  He has a chronic problem with air swallowing belching.  He has had endoscopy in the last year which was negative. Past Medical History:  Diagnosis Date  . ADD (attention deficit disorder)    mild  . Depression   . Hyperglycemia   . Hyperlipidemia   . Hypertension   . Inguinal hernia    right; only felt with coughing  . Peripheral neuropathy     Patient Active Problem List   Diagnosis Date Noted  . Essential hypertension 02/13/2017  . Hypercholesterolemia 02/13/2017    Past Surgical History:  Procedure Laterality Date  . CHOLECYSTECTOMY    . COLONOSCOPY WITH PROPOFOL N/A 03/20/2017   Procedure: COLONOSCOPY WITH PROPOFOL;  Surgeon: Garlan Fair, MD;  Location: [facility];  Service: Endoscopy;  Laterality: N/A;  . colonscopy  5 yrs ago  . ESOPHAGOGASTRODUODENOSCOPY (EGD) WITH PROPOFOL N/A 03/20/2017   Procedure: ESOPHAGOGASTRODUODENOSCOPY (EGD) WITH PROPOFOL;  Surgeon: Garlan Fair, MD;  Location: [facility];  Service: Endoscopy;  Laterality: N/A;  . EYE SURGERY    . RETINAL LASER PROCEDURE     OS       Home Medications    Prior to Admission medications   Medication Sig Start Date End Date Taking? Authorizing Provider  ANDRODERM 2 MG/24HR PT24 Place 1 patch onto the skin daily. 03/10/17   [provider]  azithromycin (ZITHROMAX) 250 MG tablet Take 2 tabs PO x 1 dose, then 1 tab PO QD x 4 days 03/30/18   Darlyne Russian, MD  benzonatate (TESSALON) 100 MG capsule Take 1-2 capsules (100-200 mg total) by mouth 3 (three) times daily as needed for cough. 03/30/18   Darlyne Russian,  MD  bisoprolol (ZEBETA) 10 MG tablet Take 1 tablet by mouth every evening. 02/23/17   [provider]  FLUoxetine (PROZAC) 20 MG capsule Take 20 mg by mouth every evening.     [provider]  lansoprazole (PREVACID) 15 MG capsule Take 15 mg by mouth every evening.     [provider]  prednisoLONE acetate (PRED FORTE) 1 % ophthalmic suspension Place 1 drop into the left eye 4 (four) times daily. Patient not taking: Reported on 10/26/2017 10/17/17 10/17/18  Bernarda Caffey, MD  traZODone (DESYREL) 50 MG tablet Take 50 mg by mouth at bedtime as needed for sleep.    [provider]    Family History Family History  Problem Relation Age of Onset  . Heart failure Mother   . Alzheimer's disease Mother   . Cancer Father        lung cancer, prostate cancer  . Parkinson's disease Father   . Cancer Brother        esophageal cancer  . Diabetes Brother   . Heart failure Sister   . Fibromyalgia Sister   . COPD Sister   . Alzheimer's disease Brother   . Healthy Son   . Healthy Son   . Healthy Son   . Amblyopia Neg Hx   . Blindness  Neg Hx   . Cataracts Neg Hx   . Glaucoma Neg Hx   . Macular degeneration Neg Hx   . Retinal detachment Neg Hx   . Strabismus Neg Hx   . Retinitis pigmentosa Neg Hx     Social History Social History   Tobacco Use  . Smoking status: Never Smoker  . Smokeless tobacco: Never Used  Substance Use Topics  . Alcohol use: No  . Drug use: No     Allergies   Atorvastatin; Rosuvastatin calcium; Simvastatin; and Tamsulosin hcl   Review of Systems Review of Systems  Constitutional: Negative.   HENT: Positive for trouble swallowing and voice change.   Eyes: Negative.   Respiratory: Positive for cough. Negative for shortness of breath and wheezing.   Cardiovascular: Negative.   Gastrointestinal:       Has chronic problems with belching.  Endoscopy done was -7 months ago.  Endocrine: Negative.      Physical Exam Triage  Vital Signs ED Triage Vitals  Enc Vitals Group     BP 03/30/18 1216 129/84     Pulse Rate 03/30/18 1216 69     Resp 03/30/18 1216 18     Temp 03/30/18 1216 98.4 F (36.9 C)     Temp Source 03/30/18 1216 Oral     SpO2 03/30/18 1216 96 %     Weight 03/30/18 1217 234 lb (106.1 kg)     Height 03/30/18 1217 6\' 1"  (1.854 m)     Head Circumference --      Peak Flow --      Pain Score 03/30/18 1217 0     Pain Loc --      Pain Edu? --      Excl. in [place]? --    No data found.  Updated Vital Signs BP 129/84 (BP Location: Right Arm)   Pulse 69   Temp 98.4 F (36.9 C) (Oral)   Resp 18   Ht 6\' 1"  (1.854 m)   Wt 234 lb (106.1 kg)   SpO2 96%   BMI 30.87 kg/m   Visual Acuity Right Eye Distance:   Left Eye Distance:   Bilateral Distance:    Right Eye Near:   Left Eye Near:    Bilateral Near:     Physical Exam  Constitutional: He appears well-developed and well-nourished.  HENT:  TMs are clear  Neck: Normal range of motion.  Cardiovascular: Normal rate, regular rhythm and normal heart sounds.  Pulmonary/Chest: Effort normal.  Abdominal: Soft. There is no tenderness.     UC Treatments / Results  Labs (all labs ordered are listed, but only abnormal results are displayed) Labs Reviewed - No data to display  EKG None Radiology Dg Chest 2 View  Result Date: 03/30/2018 CLINICAL DATA:  Cough for 3 weeks EXAM: CHEST - 2 VIEW COMPARISON:  07/11/2013 FINDINGS: Normal heart size, mediastinal contours, and pulmonary vascularity. Mild peribronchial thickening with minimal RIGHT basilar atelectasis. Lungs otherwise clear. No pleural effusion or pneumothorax. Bones unremarkable. IMPRESSION: Mild bronchitic changes with subsegmental atelectasis at RIGHT base. Otherwise negative exam. Electronically Signed   By: Lavonia Dana M.D.   On: 03/30/2018 13:07    Procedures Procedures (including critical care time)  Medications Ordered in UC Medications - No data to display   Initial  Impression / Assessment and Plan / UC Course  I have reviewed the triage vital signs and the nursing notes.  Pertinent labs & imaging results that were available during my care  of the patient were reviewed by me and considered in my medical decision making (see chart for details).     Chest x-ray shows bronchitic changes.  There is atelectasis in the right base.  Will treat with Z-Pak and Tessalon Perles.  He is going to follow-up with the ENT because of recurrent problems with belching and suspected laryngeal irritation from this.  Final Clinical Impressions(s) / UC Diagnoses   Final diagnoses:  Acute bronchitis, unspecified organism    ED Discharge Orders        Ordered    azithromycin (ZITHROMAX) 250 MG tablet     03/30/18 1330    benzonatate (TESSALON) 100 MG capsule  3 times daily PRN     03/30/18 1330      Please follow-up with an ENT. Take antibiotics as instructed. Follow-up with your family doctor if not better next week. Controlled Substance Prescriptions [place] Controlled Substance Registry consulted? Not Applicable really not   Darlyne Russian, MD 03/30/18 1357

## 2018-03-30 NOTE — ED Triage Notes (Signed)
Pt c/o cough x 2-3 weeks. Started out productive but has lingered and turned into dry cough. Wakes him up throughout the night. Taking Mucinex prn. Also concerned with constant belching.

## 2018-03-30 NOTE — Discharge Instructions (Addendum)
Please follow-up with an ENT. Take antibiotics as instructed. Follow-up with your family doctor if not better next week.

## 2018-04-09 ENCOUNTER — Ambulatory Visit (INDEPENDENT_AMBULATORY_CARE_PROVIDER_SITE_OTHER): Payer: PPO

## 2018-04-09 ENCOUNTER — Ambulatory Visit: Payer: PPO | Admitting: Podiatry

## 2018-04-09 ENCOUNTER — Encounter: Payer: Self-pay | Admitting: Podiatry

## 2018-04-09 DIAGNOSIS — M722 Plantar fascial fibromatosis: Secondary | ICD-10-CM

## 2018-04-09 DIAGNOSIS — M79671 Pain in right foot: Secondary | ICD-10-CM | POA: Diagnosis not present

## 2018-04-09 DIAGNOSIS — M2141 Flat foot [pes planus] (acquired), right foot: Secondary | ICD-10-CM | POA: Diagnosis not present

## 2018-04-09 DIAGNOSIS — M2142 Flat foot [pes planus] (acquired), left foot: Secondary | ICD-10-CM | POA: Diagnosis not present

## 2018-04-09 DIAGNOSIS — M779 Enthesopathy, unspecified: Secondary | ICD-10-CM

## 2018-04-09 DIAGNOSIS — G629 Polyneuropathy, unspecified: Secondary | ICD-10-CM | POA: Diagnosis not present

## 2018-04-09 MED ORDER — GABAPENTIN 100 MG PO CAPS: 100.0000 mg | ORAL_CAPSULE | Freq: Every day | ORAL | 3 refills | Status: DC

## 2018-04-09 NOTE — Progress Notes (Signed)
Subjective:    Patient ID: Jason Franco, male    DOB: [birth year], 67 y.o.   MRN: [id number]  HPI 67 year old male presents the office today for concerns of bilateral foot pain, neuropathy.  He was diagnosed with neuropathy about 4 years ago when he was seen a neurologist for nerve conduction test.  He states that he started to get more burning to his feet as well as more tingling he is never had treatment for neuropathy but he is interested in starting medication for this.  He also states that he has a flat foot which causes some discomfort.  He is also concerned that he has been on 4 different statin medications has been causing a lot of muscle and joint pain.  He gets pain to his feet mostly towards the end the day after being on his feet all day mostly to the arch of the foot.  He said no recent treatment for this.  He has no other concerns today.   Review of Systems  All other systems reviewed and are negative.  Past Medical History:  Diagnosis Date  . ADD (attention deficit disorder)    mild  . Depression   . Hyperglycemia   . Hyperlipidemia   . Hypertension   . Inguinal hernia    right; only felt with coughing  . Peripheral neuropathy     Past Surgical History:  Procedure Laterality Date  . CHOLECYSTECTOMY    . COLONOSCOPY WITH PROPOFOL N/A 03/20/2017   Procedure: COLONOSCOPY WITH PROPOFOL;  Surgeon: Garlan Fair, MD;  Location: [facility];  Service: Endoscopy;  Laterality: N/A;  . colonscopy  5 yrs ago  . ESOPHAGOGASTRODUODENOSCOPY (EGD) WITH PROPOFOL N/A 03/20/2017   Procedure: ESOPHAGOGASTRODUODENOSCOPY (EGD) WITH PROPOFOL;  Surgeon: Garlan Fair, MD;  Location[facility];  Service: Endoscopy;  Laterality: N/A;  . EYE SURGERY    . RETINAL LASER PROCEDURE     OS     Current Outpatient Medications:  .  ANDRODERM 2 MG/24HR PT24, Place 1 patch onto the skin daily., Disp: , Rfl:  .  azithromycin (ZITHROMAX) 250 MG tablet, Take 2 tabs PO x 1 dose, then 1 tab PO  QD x 4 days (Patient not taking: Reported on 04/09/2018), Disp: 6 tablet, Rfl: 0 .  benzonatate (TESSALON) 100 MG capsule, Take 1-2 capsules (100-200 mg total) by mouth 3 (three) times daily as needed for cough. (Patient not taking: Reported on 04/09/2018), Disp: 40 capsule, Rfl: 0 .  bisoprolol (ZEBETA) 10 MG tablet, Take 1 tablet by mouth every evening., Disp: , Rfl: 2 .  FLUoxetine (PROZAC) 20 MG capsule, Take 20 mg by mouth every evening. , Disp: , Rfl:  .  gabapentin (NEURONTIN) 100 MG capsule, Take 1 capsule (100 mg total) by mouth at bedtime., Disp: 90 capsule, Rfl: 3 .  lansoprazole (PREVACID) 15 MG capsule, Take 15 mg by mouth every evening. , Disp: , Rfl:  .  pravastatin (PRAVACHOL) 20 MG tablet, TK 1 T PO QD, Disp: , Rfl: 11 .  prednisoLONE acetate (PRED FORTE) 1 % ophthalmic suspension, Place 1 drop into the left eye 4 (four) times daily. (Patient not taking: Reported on 10/26/2017), Disp: 10 mL, Rfl: 0 .  traZODone (DESYREL) 50 MG tablet, Take 50 mg by mouth at bedtime as needed for sleep., Disp: , Rfl:   Allergies  Allergen Reactions  . Atorvastatin Other (See Comments)    Muscle pain  . Rosuvastatin Calcium Other (See Comments)    Muscle  pain  . Simvastatin Other (See Comments)    Muscle pain  . Tamsulosin Hcl Other (See Comments)    Lightheadedness     Social History   Socioeconomic History  . Marital status: Married    Spouse name: Not on file  . Number of children: Not on file  . Years of education: Not on file  . Highest education level: Not on file  Occupational History  . Not on file  Social Needs  . Financial resource strain: Not on file  . Food insecurity:    Worry: Not on file    Inability: Not on file  . Transportation needs:    Medical: Not on file    Non-medical: Not on file  Tobacco Use  . Smoking status: Never Smoker  . Smokeless tobacco: Never Used  Substance and Sexual Activity  . Alcohol use: No  . Drug use: No  . Sexual activity: Not on file   Lifestyle  . Physical activity:    Days per week: Not on file    Minutes per session: Not on file  . Stress: Not on file  Relationships  . Social connections:    Talks on phone: Not on file    Gets together: Not on file    Attends religious service: Not on file    Active member of club or organization: Not on file    Attends meetings of clubs or organizations: Not on file    Relationship status: Not on file  . Intimate partner violence:    Fear of current or ex partner: Not on file    Emotionally abused: Not on file    Physically abused: Not on file    Forced sexual activity: Not on file  Other Topics Concern  . Not on file  Social History Narrative  . Not on file         Objective:   Physical Exam  General: AAO x3, NAD  Dermatological: Skin is warm, dry and supple bilateral. Nails x 10 are well manicured; remaining integument appears unremarkable at this time. There are no open sores, no preulcerative lesions, no rash or signs of infection present.  Vascular: Dorsalis Pedis artery and Posterior Tibial artery pedal pulses are 2/4 bilateral with immedate capillary fill time. Pedal hair growth present. No varicosities and no lower extremity edema present bilateral. There is no pain with calf compression, swelling, warmth, erythema.   Neruologic: Sensation decreased with SWMF, vibratory sensation intact. Negative tinel sign.   Musculoskeletal: There is a decrease in medial arch upon weightbearing.  Subjectively on palpation of the medial band of plantar fascia is radius discomfort towards the end of the day.  He is able to do a single and double heel rise without any issues.  There is no pain with lateral compression of calcaneus.  Achilles tendon appears to be intact.  Plantar fascia appears to be intact.  No other areas of tenderness identified at this time.  Muscular strength 5/5 in all groups tested bilateral.  Gait: Unassisted, Nonantalgic.      Assessment & Plan:    67 year old male with bilateral flatfoot, plantar fasciitis; neuropathy -Treatment options discussed including all alternatives, risks, and complications -Etiology of symptoms were discussed -X-rays were obtained and reviewed with the patient.  There is no definitive evidence of acute fracture or stress fracture identified today. -In regards to neuropathy we discussed treatment options and he wished to proceed with medication at this time for it.  Discussed gabapentin.  We will start 100 mg at nighttime and slowly titrate up based on his symptoms as well as potential side effects which were discussed today. -In regards to his flat feet we discussed shoe modifications and orthotics.  Discussed more custom orthotic given his significant flatfoot.  He wishes to proceed with custom inserts.  He was molded for orthotics today. -Follow-up in 4 weeks or sooner if needed.  Trula Slade DPM

## 2018-04-09 NOTE — Patient Instructions (Signed)
Start gabapentin 100mg  at night (1 pill); on week 2 go up to 200mg  at night (2 pills); on week 3 go up to 300 mg (3 pills) at night. If you have any side affects then go to the lower dose.   If was nice to meet you today. If you have any questions or any further concerns, please feel fee to give me a call. You can call our office at [phone] or please feel fee to send me a message through [facility].   -----    Gabapentin capsules or tablets What is this medicine? GABAPENTIN (GA ba pen tin) is used to control partial seizures in adults with epilepsy. It is also used to treat certain types of nerve pain. This medicine may be used for other purposes; ask your health care provider or pharmacist if you have questions. COMMON BRAND NAME(S): Active-PAC with Gabapentin, Gabarone, Neurontin What should I tell my health care provider before I take this medicine? They need to know if you have any of these conditions: -kidney disease -suicidal thoughts, plans, or attempt; a previous suicide attempt by you or a family member -an unusual or allergic reaction to gabapentin, other medicines, foods, dyes, or preservatives -pregnant or trying to get pregnant -breast-feeding How should I use this medicine? Take this medicine by mouth with a glass of water. Follow the directions on the prescription label. You can take it with or without food. If it upsets your stomach, take it with food.Take your medicine at regular intervals. Do not take it more often than directed. Do not stop taking except on your doctor's advice. If you are directed to break the 600 or 800 mg tablets in half as part of your dose, the extra half tablet should be used for the next dose. If you have not used the extra half tablet within 28 days, it should be thrown away. A special MedGuide will be given to you by the pharmacist with each prescription and refill. Be sure to read this information carefully each time. Talk to your pediatrician  regarding the use of this medicine in children. Special care may be needed. Overdosage: If you think you have taken too much of this medicine contact a poison control center or emergency room at once. NOTE: This medicine is only for you. Do not share this medicine with others. What if I miss a dose? If you miss a dose, take it as soon as you can. If it is almost time for your next dose, take only that dose. Do not take double or extra doses. What may interact with this medicine? Do not take this medicine with any of the following medications: -other gabapentin products This medicine may also interact with the following medications: -alcohol -antacids -antihistamines for allergy, cough and cold -certain medicines for anxiety or sleep -certain medicines for depression or psychotic disturbances -homatropine; hydrocodone -naproxen -narcotic medicines (opiates) for pain -phenothiazines like chlorpromazine, mesoridazine, prochlorperazine, thioridazine This list may not describe all possible interactions. Give your health care provider a list of all the medicines, herbs, non-prescription drugs, or dietary supplements you use. Also tell them if you smoke, drink alcohol, or use illegal drugs. Some items may interact with your medicine. What should I watch for while using this medicine? Visit your doctor or health care professional for regular checks on your progress. You may want to keep a record at home of how you feel your condition is responding to treatment. You may want to share this information with your  doctor or health care professional at each visit. You should contact your doctor or health care professional if your seizures get worse or if you have any new types of seizures. Do not stop taking this medicine or any of your seizure medicines unless instructed by your doctor or health care professional. Stopping your medicine suddenly can increase your seizures or their severity. Wear a medical  identification bracelet or chain if you are taking this medicine for seizures, and carry a card that lists all your medications. You may get drowsy, dizzy, or have blurred vision. Do not drive, use machinery, or do anything that needs mental alertness until you know how this medicine affects you. To reduce dizzy or fainting spells, do not sit or stand up quickly, especially if you are an older patient. Alcohol can increase drowsiness and dizziness. Avoid alcoholic drinks. Your mouth may get dry. Chewing sugarless gum or sucking hard candy, and drinking plenty of water will help. The use of this medicine may increase the chance of suicidal thoughts or actions. Pay special attention to how you are responding while on this medicine. Any worsening of mood, or thoughts of suicide or dying should be reported to your health care professional right away. Women who become pregnant while using this medicine may enroll in the [place] Pregnancy Registry by calling ([phone]. This registry collects information about the safety of antiepileptic drug use during pregnancy. What side effects may I notice from receiving this medicine? Side effects that you should report to your doctor or health care professional as soon as possible: -allergic reactions like skin rash, itching or hives, swelling of the face, lips, or tongue -worsening of mood, thoughts or actions of suicide or dying Side effects that usually do not require medical attention (report to your doctor or health care professional if they continue or are bothersome): -constipation -difficulty walking or controlling muscle movements -dizziness -nausea -slurred speech -tiredness -tremors -weight gain This list may not describe all possible side effects. Call your doctor for medical advice about side effects. You may report side effects to FDA at 1-800-FDA-1088. Where should I keep my medicine? Keep out of reach of  children. This medicine may cause accidental overdose and death if it taken by other adults, children, or pets. Mix any unused medicine with a substance like cat litter or coffee grounds. Then throw the medicine away in a sealed container like a sealed bag or a coffee can with a lid. Do not use the medicine after the expiration date. Store at room temperature between 15 and 30 degrees C (59 and 86 degrees F). NOTE: This sheet is a summary. It may not cover all possible information. If you have questions about this medicine, talk to your doctor, pharmacist, or health care provider.  2018 Elsevier/Gold Standard (2014-01-30 15:26:50)

## 2018-04-29 ENCOUNTER — Ambulatory Visit: Payer: PPO | Admitting: Orthotics

## 2018-04-29 DIAGNOSIS — G629 Polyneuropathy, unspecified: Secondary | ICD-10-CM

## 2018-04-29 DIAGNOSIS — M2141 Flat foot [pes planus] (acquired), right foot: Secondary | ICD-10-CM

## 2018-04-29 DIAGNOSIS — M2142 Flat foot [pes planus] (acquired), left foot: Secondary | ICD-10-CM

## 2018-04-29 NOTE — Progress Notes (Signed)
Patient came in today to pick up custom made foot orthotics.  The goals were accomplished and the patient reported no dissatisfaction with said orthotics.  Patient was advised of breakin period and how to report any issues. 

## 2018-05-09 ENCOUNTER — Ambulatory Visit (INDEPENDENT_AMBULATORY_CARE_PROVIDER_SITE_OTHER): Payer: PPO

## 2018-05-09 ENCOUNTER — Other Ambulatory Visit: Payer: PPO | Admitting: Orthotics

## 2018-05-09 ENCOUNTER — Encounter: Payer: Self-pay | Admitting: Podiatry

## 2018-05-09 ENCOUNTER — Ambulatory Visit: Payer: PPO | Admitting: Podiatry

## 2018-05-09 DIAGNOSIS — S93402A Sprain of unspecified ligament of left ankle, initial encounter: Secondary | ICD-10-CM | POA: Diagnosis not present

## 2018-05-09 DIAGNOSIS — G629 Polyneuropathy, unspecified: Secondary | ICD-10-CM | POA: Diagnosis not present

## 2018-05-09 MED ORDER — GABAPENTIN 300 MG PO CAPS: 300.0000 mg | ORAL_CAPSULE | Freq: Every day | ORAL | 3 refills | Status: AC

## 2018-05-14 NOTE — Addendum Note (Signed)
Addended by: Harriett Sine D on: 05/14/2018 08:53 AM   Modules accepted: Orders

## 2018-05-14 NOTE — Progress Notes (Signed)
Subjective: 67 year old male presents the office today for follow-up evaluation of neuropathy.  He states he is currently taking 300 mg at nighttime he states that he is tolerating this well and is also had significant improvement in the neuropathy symptoms.  He states that he has made " a world of difference". He also has new concerns today of pain to his left ankle.  He states that on Tuesday morning he slipped on some pine needles going down a hill and he twisted his ankle and he describes an inversion type injury.  He is able to put weight on his foot and has gotten better but he does have pain he points to the lateral aspect of the ankle.  No recent treatment.  He has no other concerns. Denies any systemic complaints such as fevers, chills, nausea, vomiting. No acute changes since last appointment, and no other complaints at this time.   Objective: AAO x3, NAD DP/PT pulses palpable bilaterally, CRT less than 3 seconds Protective sensation decreasedwith Simms Weinstein monofilament, vibratory sensation intact There is tenderness palpation along the course of the ATFL and the left ankle.  There is no pain in the CFL, PT FL, syndesmosis, deltoid ligaments.  There is no pain on the flexor or extensor tendons.  Ankle joint range of motion intact.  There is no area pinpoint bony tenderness or pain to vibratory sensation.  Minimal edema to the left lateral ankle.  No erythema or increase in warmth. No open lesions or pre-ulcerative lesions.  No pain with calf compression, swelling, warmth, erythema  Assessment: Improving neuropathy symptoms on gabapentin, left ankle sprain  Plan: -All treatment options discussed with the patient including all alternatives, risks, complications.  -In regards to neuropathy we continue gabapentin 300 mg at nighttime and evaluated by the neurology he states.  We have filled this today. -In regards to the left ankle x-rays were obtained reviewed.  There is no evidence of  acute fracture or stress fracture identified today.  Tri-Lock ankle brace was dispensed.  Stretching, rehab exercises were discussed and I like for him to start once the pain subsides.  Ice to the area daily.  Follow-up in the next 2 to 3 weeks if symptoms continue or sooner if any issues are to arise. -Patient encouraged to call the office with any questions, concerns, change in symptoms.   Trula Slade DPM

## 2018-05-17 DIAGNOSIS — M79672 Pain in left foot: Secondary | ICD-10-CM | POA: Diagnosis not present

## 2018-05-17 DIAGNOSIS — I10 Essential (primary) hypertension: Secondary | ICD-10-CM | POA: Diagnosis not present

## 2018-05-17 DIAGNOSIS — M25672 Stiffness of left ankle, not elsewhere classified: Secondary | ICD-10-CM | POA: Diagnosis not present

## 2018-05-17 DIAGNOSIS — M25572 Pain in left ankle and joints of left foot: Secondary | ICD-10-CM | POA: Diagnosis not present

## 2018-05-17 DIAGNOSIS — R269 Unspecified abnormalities of gait and mobility: Secondary | ICD-10-CM | POA: Diagnosis not present

## 2018-05-17 DIAGNOSIS — M6281 Muscle weakness (generalized): Secondary | ICD-10-CM | POA: Diagnosis not present

## 2018-05-17 DIAGNOSIS — M79671 Pain in right foot: Secondary | ICD-10-CM | POA: Diagnosis not present

## 2018-05-21 DIAGNOSIS — M25672 Stiffness of left ankle, not elsewhere classified: Secondary | ICD-10-CM | POA: Diagnosis not present

## 2018-05-21 DIAGNOSIS — M79672 Pain in left foot: Secondary | ICD-10-CM | POA: Diagnosis not present

## 2018-05-21 DIAGNOSIS — M79671 Pain in right foot: Secondary | ICD-10-CM | POA: Diagnosis not present

## 2018-05-21 DIAGNOSIS — R269 Unspecified abnormalities of gait and mobility: Secondary | ICD-10-CM | POA: Diagnosis not present

## 2018-05-21 DIAGNOSIS — M6281 Muscle weakness (generalized): Secondary | ICD-10-CM | POA: Diagnosis not present

## 2018-05-21 DIAGNOSIS — M25572 Pain in left ankle and joints of left foot: Secondary | ICD-10-CM | POA: Diagnosis not present

## 2018-05-21 DIAGNOSIS — I10 Essential (primary) hypertension: Secondary | ICD-10-CM | POA: Diagnosis not present

## 2018-06-04 DIAGNOSIS — M25672 Stiffness of left ankle, not elsewhere classified: Secondary | ICD-10-CM | POA: Diagnosis not present

## 2018-06-04 DIAGNOSIS — I10 Essential (primary) hypertension: Secondary | ICD-10-CM | POA: Diagnosis not present

## 2018-06-04 DIAGNOSIS — M25572 Pain in left ankle and joints of left foot: Secondary | ICD-10-CM | POA: Diagnosis not present

## 2018-06-04 DIAGNOSIS — M79671 Pain in right foot: Secondary | ICD-10-CM | POA: Diagnosis not present

## 2018-06-04 DIAGNOSIS — M6281 Muscle weakness (generalized): Secondary | ICD-10-CM | POA: Diagnosis not present

## 2018-06-04 DIAGNOSIS — M79672 Pain in left foot: Secondary | ICD-10-CM | POA: Diagnosis not present

## 2018-06-04 DIAGNOSIS — R269 Unspecified abnormalities of gait and mobility: Secondary | ICD-10-CM | POA: Diagnosis not present

## 2018-06-07 DIAGNOSIS — I10 Essential (primary) hypertension: Secondary | ICD-10-CM | POA: Diagnosis not present

## 2018-06-07 DIAGNOSIS — R269 Unspecified abnormalities of gait and mobility: Secondary | ICD-10-CM | POA: Diagnosis not present

## 2018-06-07 DIAGNOSIS — M79671 Pain in right foot: Secondary | ICD-10-CM | POA: Diagnosis not present

## 2018-06-07 DIAGNOSIS — M25572 Pain in left ankle and joints of left foot: Secondary | ICD-10-CM | POA: Diagnosis not present

## 2018-06-07 DIAGNOSIS — M25672 Stiffness of left ankle, not elsewhere classified: Secondary | ICD-10-CM | POA: Diagnosis not present

## 2018-06-07 DIAGNOSIS — M6281 Muscle weakness (generalized): Secondary | ICD-10-CM | POA: Diagnosis not present

## 2018-06-07 DIAGNOSIS — M79672 Pain in left foot: Secondary | ICD-10-CM | POA: Diagnosis not present

## 2018-06-11 DIAGNOSIS — M79671 Pain in right foot: Secondary | ICD-10-CM | POA: Diagnosis not present

## 2018-06-11 DIAGNOSIS — R269 Unspecified abnormalities of gait and mobility: Secondary | ICD-10-CM | POA: Diagnosis not present

## 2018-06-11 DIAGNOSIS — M79672 Pain in left foot: Secondary | ICD-10-CM | POA: Diagnosis not present

## 2018-06-11 DIAGNOSIS — I10 Essential (primary) hypertension: Secondary | ICD-10-CM | POA: Diagnosis not present

## 2018-06-11 DIAGNOSIS — M6281 Muscle weakness (generalized): Secondary | ICD-10-CM | POA: Diagnosis not present

## 2018-06-11 DIAGNOSIS — M25572 Pain in left ankle and joints of left foot: Secondary | ICD-10-CM | POA: Diagnosis not present

## 2018-06-11 DIAGNOSIS — M25672 Stiffness of left ankle, not elsewhere classified: Secondary | ICD-10-CM | POA: Diagnosis not present

## 2018-06-13 DIAGNOSIS — M79671 Pain in right foot: Secondary | ICD-10-CM | POA: Diagnosis not present

## 2018-06-13 DIAGNOSIS — M6281 Muscle weakness (generalized): Secondary | ICD-10-CM | POA: Diagnosis not present

## 2018-06-13 DIAGNOSIS — M25672 Stiffness of left ankle, not elsewhere classified: Secondary | ICD-10-CM | POA: Diagnosis not present

## 2018-06-13 DIAGNOSIS — M25572 Pain in left ankle and joints of left foot: Secondary | ICD-10-CM | POA: Diagnosis not present

## 2018-06-13 DIAGNOSIS — R269 Unspecified abnormalities of gait and mobility: Secondary | ICD-10-CM | POA: Diagnosis not present

## 2018-06-13 DIAGNOSIS — I10 Essential (primary) hypertension: Secondary | ICD-10-CM | POA: Diagnosis not present

## 2018-06-13 DIAGNOSIS — M79672 Pain in left foot: Secondary | ICD-10-CM | POA: Diagnosis not present

## 2018-06-21 DIAGNOSIS — M6281 Muscle weakness (generalized): Secondary | ICD-10-CM | POA: Diagnosis not present

## 2018-06-21 DIAGNOSIS — M25672 Stiffness of left ankle, not elsewhere classified: Secondary | ICD-10-CM | POA: Diagnosis not present

## 2018-06-21 DIAGNOSIS — R269 Unspecified abnormalities of gait and mobility: Secondary | ICD-10-CM | POA: Diagnosis not present

## 2018-06-21 DIAGNOSIS — I10 Essential (primary) hypertension: Secondary | ICD-10-CM | POA: Diagnosis not present

## 2018-06-21 DIAGNOSIS — M79671 Pain in right foot: Secondary | ICD-10-CM | POA: Diagnosis not present

## 2018-06-21 DIAGNOSIS — M79672 Pain in left foot: Secondary | ICD-10-CM | POA: Diagnosis not present

## 2018-06-21 DIAGNOSIS — M25572 Pain in left ankle and joints of left foot: Secondary | ICD-10-CM | POA: Diagnosis not present

## 2018-07-04 DIAGNOSIS — M79671 Pain in right foot: Secondary | ICD-10-CM | POA: Diagnosis not present

## 2018-07-04 DIAGNOSIS — M6281 Muscle weakness (generalized): Secondary | ICD-10-CM | POA: Diagnosis not present

## 2018-07-04 DIAGNOSIS — M25572 Pain in left ankle and joints of left foot: Secondary | ICD-10-CM | POA: Diagnosis not present

## 2018-07-04 DIAGNOSIS — M25672 Stiffness of left ankle, not elsewhere classified: Secondary | ICD-10-CM | POA: Diagnosis not present

## 2018-07-04 DIAGNOSIS — I10 Essential (primary) hypertension: Secondary | ICD-10-CM | POA: Diagnosis not present

## 2018-07-04 DIAGNOSIS — R269 Unspecified abnormalities of gait and mobility: Secondary | ICD-10-CM | POA: Diagnosis not present

## 2018-07-04 DIAGNOSIS — M79672 Pain in left foot: Secondary | ICD-10-CM | POA: Diagnosis not present

## 2018-07-05 ENCOUNTER — Other Ambulatory Visit: Payer: Self-pay

## 2018-07-05 ENCOUNTER — Ambulatory Visit (INDEPENDENT_AMBULATORY_CARE_PROVIDER_SITE_OTHER): Payer: PPO | Admitting: Emergency Medicine

## 2018-07-05 ENCOUNTER — Encounter: Payer: Self-pay | Admitting: Emergency Medicine

## 2018-07-05 VITALS — BP 128/87 | HR 92 | Temp 98.4°F | Resp 17 | Ht 73.0 in | Wt 229.4 lb

## 2018-07-05 DIAGNOSIS — L089 Local infection of the skin and subcutaneous tissue, unspecified: Secondary | ICD-10-CM | POA: Diagnosis not present

## 2018-07-05 DIAGNOSIS — L723 Sebaceous cyst: Secondary | ICD-10-CM | POA: Diagnosis not present

## 2018-07-05 MED ORDER — CEPHALEXIN 500 MG PO CAPS: 500.0000 mg | ORAL_CAPSULE | Freq: Three times a day (TID) | ORAL | 0 refills | Status: AC

## 2018-07-05 MED ORDER — MUPIROCIN 2 % EX OINT: TOPICAL_OINTMENT | CUTANEOUS | 1 refills | Status: AC

## 2018-07-05 NOTE — Progress Notes (Signed)
Jason Franco 67 y.o.   Chief Complaint  Patient presents with  . New Patient (Initial Visit)    cyst on right shoulder as long as pt can remember but not bothersome until about 3 weeks ago, red and raised and the size of a silver dollar, draining a creamy white fluid x 1 1/2 weeks ago and pt wants to make sure it is ok    HISTORY OF PRESENT ILLNESS: This is a 67 y.o. male complaining of sebaceous cyst to the right shoulder area for many many years but now draining for the past few days with some erythema and swelling.  HPI   Prior to Admission medications   Medication Sig Start Date End Date Taking? Authorizing Provider  ANDRODERM 2 MG/24HR PT24 Place 1 patch onto the skin daily. 03/10/17  Yes [provider]  bisoprolol (ZEBETA) 10 MG tablet Take 1 tablet by mouth every evening. 02/23/17  Yes [provider]  FLUoxetine (PROZAC) 20 MG capsule Take 20 mg by mouth every evening.    Yes [provider]  gabapentin (NEURONTIN) 300 MG capsule Take 1 capsule (300 mg total) by mouth at bedtime. 05/09/18  Yes Trula Slade, DPM  lansoprazole (PREVACID) 15 MG capsule Take 15 mg by mouth every evening.    Yes [provider]  pravastatin (PRAVACHOL) 20 MG tablet TK 1 T PO QD 03/02/18  Yes [provider]  traZODone (DESYREL) 50 MG tablet Take 50 mg by mouth at bedtime as needed for sleep.   Yes [provider]  azithromycin (ZITHROMAX) 250 MG tablet Take 2 tabs PO x 1 dose, then 1 tab PO QD x 4 days Patient not taking: Reported on 04/09/2018 03/30/18   Darlyne Russian, MD  benzonatate (TESSALON) 100 MG capsule Take 1-2 capsules (100-200 mg total) by mouth 3 (three) times daily as needed for cough. Patient not taking: Reported on 04/09/2018 03/30/18   Darlyne Russian, MD  prednisoLONE acetate (PRED FORTE) 1 % ophthalmic suspension Place 1 drop into the left eye 4 (four) times daily. Patient not taking: Reported on 10/26/2017 10/17/17 10/17/18   Bernarda Caffey, MD    Allergies  Allergen Reactions  . Atorvastatin Other (See Comments)    Muscle pain  . Rosuvastatin Calcium Other (See Comments)    Muscle pain  . Simvastatin Other (See Comments)    Muscle pain  . Tamsulosin Hcl Other (See Comments)    Lightheadedness     Patient Active Problem List   Diagnosis Date Noted  . Essential hypertension 02/13/2017  . Hypercholesterolemia 02/13/2017    Past Medical History:  Diagnosis Date  . ADD (attention deficit disorder)    mild  . Depression   . Hyperglycemia   . Hyperlipidemia   . Hypertension   . Inguinal hernia    right; only felt with coughing  . Peripheral neuropathy     Past Surgical History:  Procedure Laterality Date  . CHOLECYSTECTOMY    . COLONOSCOPY WITH PROPOFOL N/A 03/20/2017   Procedure: COLONOSCOPY WITH PROPOFOL;  Surgeon: Garlan Fair, MD;  Location: [facility];  Service: Endoscopy;  Laterality: N/A;  . colonscopy  5 yrs ago  . ESOPHAGOGASTRODUODENOSCOPY (EGD) WITH PROPOFOL N/A 03/20/2017   Procedure: ESOPHAGOGASTRODUODENOSCOPY (EGD) WITH PROPOFOL;  Surgeon: Garlan Fair, MD;  Location: [facility];  Service: Endoscopy;  Laterality: N/A;  . EYE SURGERY    . RETINAL LASER PROCEDURE     OS    Social History  Socioeconomic History  . Marital status: Married    Spouse name: Not on file  . Number of children: Not on file  . Years of education: Not on file  . Highest education level: Not on file  Occupational History  . Not on file  Social Needs  . Financial resource strain: Not on file  . Food insecurity:    Worry: Not on file    Inability: Not on file  . Transportation needs:    Medical: Not on file    Non-medical: Not on file  Tobacco Use  . Smoking status: Never Smoker  . Smokeless tobacco: Never Used  Substance and Sexual Activity  . Alcohol use: No  . Drug use: No  . Sexual activity: Not on file  Lifestyle  . Physical activity:    Days per week: Not on file    Minutes  per session: Not on file  . Stress: Not on file  Relationships  . Social connections:    Talks on phone: Not on file    Gets together: Not on file    Attends religious service: Not on file    Active member of club or organization: Not on file    Attends meetings of clubs or organizations: Not on file    Relationship status: Not on file  . Intimate partner violence:    Fear of current or ex partner: Not on file    Emotionally abused: Not on file    Physically abused: Not on file    Forced sexual activity: Not on file  Other Topics Concern  . Not on file  Social History Narrative  . Not on file    Family History  Problem Relation Age of Onset  . Heart failure Mother   . Alzheimer's disease Mother   . Cancer Father        lung cancer, prostate cancer  . Parkinson's disease Father   . Cancer Brother        esophageal cancer  . Diabetes Brother   . Heart failure Sister   . Fibromyalgia Sister   . COPD Sister   . Alzheimer's disease Brother   . Healthy Son   . Healthy Son   . Healthy Son   . Amblyopia Neg Hx   . Blindness Neg Hx   . Cataracts Neg Hx   . Glaucoma Neg Hx   . Macular degeneration Neg Hx   . Retinal detachment Neg Hx   . Strabismus Neg Hx   . Retinitis pigmentosa Neg Hx      Review of Systems  Constitutional: Negative.  Negative for chills and fever.  HENT: Negative for sore throat.   Eyes: Negative.   Respiratory: Negative.  Negative for cough and shortness of breath.   Cardiovascular: Negative.  Negative for chest pain and palpitations.  Gastrointestinal: Negative.  Negative for nausea and vomiting.  Musculoskeletal: Positive for joint pain.  Skin: Negative.  Negative for rash.  Neurological: Negative.  Negative for dizziness and headaches.  Endo/Heme/Allergies: Negative.   All other systems reviewed and are negative.  Vitals:   07/05/18 1517  BP: 128/87  Pulse: 92  Resp: 17  Temp: 98.4 F (36.9 C)  SpO2: 95%     Physical Exam    Constitutional: He appears well-developed and well-nourished.  HENT:  Head: Normocephalic and atraumatic.  Eyes: Pupils are equal, round, and reactive to light. EOM are normal.  Neck: Normal range of motion.  Cardiovascular: Normal rate.  Pulmonary/Chest: Effort  normal.  Musculoskeletal: Normal range of motion.  Neurological: He is alert.  Skin: Capillary refill takes less than 2 seconds.  Right shoulder area: Positive open cyst with surrounding erythema.  Lots of cystic material drained out.  Excellent drainage.  No need for packing.  Psychiatric: He has a normal mood and affect. His behavior is normal.  Vitals reviewed.  Sebaceous cyst drained.  Cystic material removed.  Cleaned with peroxide and dressing applied.  ASSESSMENT & PLAN: Arbie was seen today for new patient (initial visit).  Diagnoses and all orders for this visit:  Infected sebaceous cyst  Other orders -     cephALEXin (KEFLEX) 500 MG capsule; Take 1 capsule (500 mg total) by mouth 3 (three) times daily for 7 days. -     mupirocin ointment (BACTROBAN) 2 %; Sig to affected area twice a day x 7 days.    Patient Instructions  Epidermal Cyst An epidermal cyst is sometimes called an epidermal inclusion cyst or an infundibular cyst. It is a sac made of skin tissue. The sac contains a substance called keratin. Keratin is a protein that is normally secreted through the hair follicles. When keratin becomes trapped in the top layer of skin (epidermis), it can form an epidermal cyst. Epidermal cysts are usually found on the face, neck, trunk, and genitals. These cysts are usually harmless (benign), and they may not cause symptoms unless they become infected. It is important not to pop epidermal cysts yourself. What are the causes? This condition may be caused by:  A blocked hair follicle.  A hair that curls and re-enters the skin instead of growing straight out of the skin (ingrown hair).  A blocked pore.  Irritated  skin.  An injury to the skin.  Certain conditions that are passed along from parent to child (inherited).  Human papillomavirus (HPV).  What increases the risk? The following factors may make you more likely to develop an epidermal cyst:  Having acne.  Being overweight.  Wearing tight clothing.  What are the signs or symptoms? The only symptom of this condition may be a small, painless lump underneath the skin. When an epidermal cyst becomes infected, symptoms may include:  Redness.  Inflammation.  Tenderness.  Warmth.  Fever.  Keratin draining from the cyst. Keratin may look like a grayish-white, bad-smelling substance.  Pus draining from the cyst.  How is this diagnosed? This condition is diagnosed with a physical exam. In some cases, you may have a sample of tissue (biopsy) taken from your cyst to be examined under a microscope or tested for bacteria. You may be referred to a health care provider who specializes in skin care (dermatologist). How is this treated? In many cases, epidermal cysts go away on their own without treatment. If a cyst becomes infected, treatment may include:  Opening and draining the cyst. After draining, minor surgery to remove the rest of the cyst may be done.  Antibiotic medicine to help prevent infection.  Injections of medicines (steroids) that help to reduce inflammation.  Surgery to remove the cyst. Surgery may be done if: ? The cyst becomes large. ? The cyst bothers you. ? There is a chance that the cyst could turn into cancer.  Follow these instructions at home:  Take over-the-counter and prescription medicines only as told by your health care provider.  If you were prescribed an antibiotic, use it as told by your health care provider. Do not stop using the antibiotic even if you start to  feel better.  Keep the area around your cyst clean and dry.  Wear loose, dry clothing.  Do not try to pop your cyst.  Avoid touching  your cyst.  Check your cyst every day for signs of infection.  Keep all follow-up visits as told by your health care provider. This is important. How is this prevented?  Wear clean, dry, clothing.  Avoid wearing tight clothing.  Keep your skin clean and dry. Shower or take baths every day.  Wash your body with a benzoyl peroxide wash when you shower or bathe. Contact a health care provider if:  Your cyst develops symptoms of infection.  Your condition is not improving or is getting worse.  You develop a cyst that looks different from other cysts you have had.  You have a fever. Get help right away if:  Redness spreads from the cyst into the surrounding area. This information is not intended to replace advice given to you by your health care provider. Make sure you discuss any questions you have with your health care provider. Document Released: 11/04/2004 Document Revised: 08/02/2016 Document Reviewed: 10/06/2015 Elsevier Interactive Patient Education  2018 Elsevier Inc.      Agustina Caroli, MD [facility]

## 2018-07-05 NOTE — Patient Instructions (Signed)
Epidermal Cyst An epidermal cyst is sometimes called an epidermal inclusion cyst or an infundibular cyst. It is a sac made of skin tissue. The sac contains a substance called keratin. Keratin is a protein that is normally secreted through the hair follicles. When keratin becomes trapped in the top layer of skin (epidermis), it can form an epidermal cyst. Epidermal cysts are usually found on the face, neck, trunk, and genitals. These cysts are usually harmless (benign), and they may not cause symptoms unless they become infected. It is important not to pop epidermal cysts yourself. What are the causes? This condition may be caused by:  A blocked hair follicle.  A hair that curls and re-enters the skin instead of growing straight out of the skin (ingrown hair).  A blocked pore.  Irritated skin.  An injury to the skin.  Certain conditions that are passed along from parent to child (inherited).  Human papillomavirus (HPV).  What increases the risk? The following factors may make you more likely to develop an epidermal cyst:  Having acne.  Being overweight.  Wearing tight clothing.  What are the signs or symptoms? The only symptom of this condition may be a small, painless lump underneath the skin. When an epidermal cyst becomes infected, symptoms may include:  Redness.  Inflammation.  Tenderness.  Warmth.  Fever.  Keratin draining from the cyst. Keratin may look like a grayish-white, bad-smelling substance.  Pus draining from the cyst.  How is this diagnosed? This condition is diagnosed with a physical exam. In some cases, you may have a sample of tissue (biopsy) taken from your cyst to be examined under a microscope or tested for bacteria. You may be referred to a health care provider who specializes in skin care (dermatologist). How is this treated? In many cases, epidermal cysts go away on their own without treatment. If a cyst becomes infected, treatment may  include:  Opening and draining the cyst. After draining, minor surgery to remove the rest of the cyst may be done.  Antibiotic medicine to help prevent infection.  Injections of medicines (steroids) that help to reduce inflammation.  Surgery to remove the cyst. Surgery may be done if: ? The cyst becomes large. ? The cyst bothers you. ? There is a chance that the cyst could turn into cancer.  Follow these instructions at home:  Take over-the-counter and prescription medicines only as told by your health care provider.  If you were prescribed an antibiotic, use it as told by your health care provider. Do not stop using the antibiotic even if you start to feel better.  Keep the area around your cyst clean and dry.  Wear loose, dry clothing.  Do not try to pop your cyst.  Avoid touching your cyst.  Check your cyst every day for signs of infection.  Keep all follow-up visits as told by your health care provider. This is important. How is this prevented?  Wear clean, dry, clothing.  Avoid wearing tight clothing.  Keep your skin clean and dry. Shower or take baths every day.  Wash your body with a benzoyl peroxide wash when you shower or bathe. Contact a health care provider if:  Your cyst develops symptoms of infection.  Your condition is not improving or is getting worse.  You develop a cyst that looks different from other cysts you have had.  You have a fever. Get help right away if:  Redness spreads from the cyst into the surrounding area. This information is   not intended to replace advice given to you by your health care provider. Make sure you discuss any questions you have with your health care provider. Document Released: 11/04/2004 Document Revised: 08/02/2016 Document Reviewed: 10/06/2015 Elsevier Interactive Patient Education  2018 Elsevier Inc.  

## 2018-07-09 DIAGNOSIS — R269 Unspecified abnormalities of gait and mobility: Secondary | ICD-10-CM | POA: Diagnosis not present

## 2018-07-09 DIAGNOSIS — M79671 Pain in right foot: Secondary | ICD-10-CM | POA: Diagnosis not present

## 2018-07-09 DIAGNOSIS — M79672 Pain in left foot: Secondary | ICD-10-CM | POA: Diagnosis not present

## 2018-07-09 DIAGNOSIS — M25572 Pain in left ankle and joints of left foot: Secondary | ICD-10-CM | POA: Diagnosis not present

## 2018-07-09 DIAGNOSIS — M6281 Muscle weakness (generalized): Secondary | ICD-10-CM | POA: Diagnosis not present

## 2018-07-09 DIAGNOSIS — M25672 Stiffness of left ankle, not elsewhere classified: Secondary | ICD-10-CM | POA: Diagnosis not present

## 2018-07-09 DIAGNOSIS — I10 Essential (primary) hypertension: Secondary | ICD-10-CM | POA: Diagnosis not present

## 2018-07-11 DIAGNOSIS — M6281 Muscle weakness (generalized): Secondary | ICD-10-CM | POA: Diagnosis not present

## 2018-07-11 DIAGNOSIS — M79672 Pain in left foot: Secondary | ICD-10-CM | POA: Diagnosis not present

## 2018-07-11 DIAGNOSIS — R269 Unspecified abnormalities of gait and mobility: Secondary | ICD-10-CM | POA: Diagnosis not present

## 2018-07-11 DIAGNOSIS — I10 Essential (primary) hypertension: Secondary | ICD-10-CM | POA: Diagnosis not present

## 2018-07-11 DIAGNOSIS — M79671 Pain in right foot: Secondary | ICD-10-CM | POA: Diagnosis not present

## 2018-07-11 DIAGNOSIS — M25672 Stiffness of left ankle, not elsewhere classified: Secondary | ICD-10-CM | POA: Diagnosis not present

## 2018-07-11 DIAGNOSIS — M25572 Pain in left ankle and joints of left foot: Secondary | ICD-10-CM | POA: Diagnosis not present

## 2018-07-16 DIAGNOSIS — M25672 Stiffness of left ankle, not elsewhere classified: Secondary | ICD-10-CM | POA: Diagnosis not present

## 2018-07-16 DIAGNOSIS — M6281 Muscle weakness (generalized): Secondary | ICD-10-CM | POA: Diagnosis not present

## 2018-07-16 DIAGNOSIS — M79672 Pain in left foot: Secondary | ICD-10-CM | POA: Diagnosis not present

## 2018-07-16 DIAGNOSIS — R269 Unspecified abnormalities of gait and mobility: Secondary | ICD-10-CM | POA: Diagnosis not present

## 2018-07-16 DIAGNOSIS — M79671 Pain in right foot: Secondary | ICD-10-CM | POA: Diagnosis not present

## 2018-07-16 DIAGNOSIS — I10 Essential (primary) hypertension: Secondary | ICD-10-CM | POA: Diagnosis not present

## 2018-07-16 DIAGNOSIS — M25572 Pain in left ankle and joints of left foot: Secondary | ICD-10-CM | POA: Diagnosis not present

## 2018-11-19 DIAGNOSIS — H3320 Serous retinal detachment, unspecified eye: Secondary | ICD-10-CM | POA: Diagnosis not present

## 2018-11-19 DIAGNOSIS — H2513 Age-related nuclear cataract, bilateral: Secondary | ICD-10-CM | POA: Diagnosis not present

## 2018-11-19 DIAGNOSIS — H43813 Vitreous degeneration, bilateral: Secondary | ICD-10-CM | POA: Diagnosis not present

## 2018-11-19 DIAGNOSIS — H31093 Other chorioretinal scars, bilateral: Secondary | ICD-10-CM | POA: Diagnosis not present

## 2018-11-28 NOTE — Progress Notes (Signed)
[facility] Note  11/29/2018     CHIEF COMPLAINT Patient presents for Retina Follow Up   HISTORY OF PRESENT ILLNESS: Jason Franco is a 67 y.o. male who presents to the clinic today for:   HPI    Retina Follow Up    Patient presents with  Retinal Break/Detachment.  In both eyes.  This started 1 year ago.  Severity is moderate.  Duration of 1 year.  Since onset it is stable.  I, the attending physician,  performed the HPI with the patient and updated documentation appropriately.          Comments    67 y/o male pt last seen 11/07/17 for RT.  Referred back by Dr. Zenia Resides, just to make sure everything with the retina is stable.  VA good ou.  Denies pain, flashes.  Has a few small floaters OU.  No gtts.  S/p laser retinopexy OS 10.31.18 w/fill-in 11.09.18.  S/p laser retinopexy OD 11.21.19.       Last edited by Bernarda Caffey, MD on 12/01/2018  1:56 PM. (History)      Referring physician: Lajean Manes, [facility]. [address] [place], [place] [address]  HISTORICAL INFORMATION:   Selected notes from the MEDICAL RECORD NUMBER Referral from Dr. Zenia Resides for retina eval Pt with history of HST and operculated hole OS noted 10.22.18, was emergently referred to Dr. Posey Pronto who performed laser retinopexy OS 10.22.18 Ocular Hx- cataract OU; PVD OU;  PMH- HTN; hypercholesterolemia;    CURRENT MEDICATIONS: No current outpatient medications on file. (Ophthalmic Drugs)   No current facility-administered medications for this visit.  (Ophthalmic Drugs)   Current Outpatient Medications (Other)  Medication Sig  . ANDRODERM 2 MG/24HR PT24 Place 1 patch onto the skin daily.  Marland Kitchen azithromycin (ZITHROMAX) 250 MG tablet Take 2 tabs PO x 1 dose, then 1 tab PO QD x 4 days (Patient not taking: Reported on 04/09/2018)  . benzonatate (TESSALON) 100 MG capsule Take 1-2 capsules (100-200 mg total) by mouth 3 (three) times daily as needed for cough. (Patient not taking:  Reported on 04/09/2018)  . bisoprolol (ZEBETA) 10 MG tablet Take 1 tablet by mouth every evening.  Marland Kitchen FLUoxetine (PROZAC) 20 MG capsule Take 20 mg by mouth every evening.   . gabapentin (NEURONTIN) 300 MG capsule Take 1 capsule (300 mg total) by mouth at bedtime.  . lansoprazole (PREVACID) 15 MG capsule Take 15 mg by mouth every evening.   . mupirocin ointment (BACTROBAN) 2 % Sig to affected area twice a day x 7 days.  . pravastatin (PRAVACHOL) 20 MG tablet TK 1 T PO QD  . traZODone (DESYREL) 50 MG tablet Take 50 mg by mouth at bedtime as needed for sleep.   No current facility-administered medications for this visit.  (Other)      REVIEW OF SYSTEMS: ROS    Positive for: Eyes, Psychiatric   Negative for: Constitutional, Gastrointestinal, Neurological, Skin, Genitourinary, Musculoskeletal, HENT, Endocrine, Cardiovascular, Respiratory, Allergic/Imm, Heme/Lymph   Last edited by Matthew Folks, COA on 11/29/2018 10:10 AM. (History)       ALLERGIES Allergies  Allergen Reactions  . Atorvastatin Other (See Comments)    Muscle pain  . Rosuvastatin Calcium Other (See Comments)    Muscle pain  . Simvastatin Other (See Comments)    Muscle pain  . Tamsulosin Hcl Other (See Comments)    Lightheadedness     PAST MEDICAL HISTORY Past Medical History:  Diagnosis Date  . ADD (attention deficit disorder)    mild  . Depression   . Hyperglycemia   . Hyperlipidemia   . Hypertension   . Inguinal hernia    right; only felt with coughing  . Peripheral neuropathy    Past Surgical History:  Procedure Laterality Date  . CHOLECYSTECTOMY    . COLONOSCOPY WITH PROPOFOL N/A 03/20/2017   Procedure: COLONOSCOPY WITH PROPOFOL;  Surgeon: Garlan Fair, MD;  Location: [facility];  Service: Endoscopy;  Laterality: N/A;  . colonscopy  5 yrs ago  . ESOPHAGOGASTRODUODENOSCOPY (EGD) WITH PROPOFOL N/A 03/20/2017   Procedure: ESOPHAGOGASTRODUODENOSCOPY (EGD) WITH PROPOFOL;  Surgeon: Garlan Fair,  MD;  Location: [facility];  Service: Endoscopy;  Laterality: N/A;  . EYE SURGERY    . RETINAL LASER PROCEDURE     OS    FAMILY HISTORY Family History  Problem Relation Age of Onset  . Heart failure Mother   . Alzheimer's disease Mother   . Cancer Father        lung cancer, prostate cancer  . Parkinson's disease Father   . Cancer Brother        esophageal cancer  . Diabetes Brother   . Heart failure Sister   . Fibromyalgia Sister   . COPD Sister   . Alzheimer's disease Brother   . Healthy Son   . Healthy Son   . Healthy Son   . Amblyopia Neg Hx   . Blindness Neg Hx   . Cataracts Neg Hx   . Glaucoma Neg Hx   . Macular degeneration Neg Hx   . Retinal detachment Neg Hx   . Strabismus Neg Hx   . Retinitis pigmentosa Neg Hx     SOCIAL HISTORY Social History   Tobacco Use  . Smoking status: Never Smoker  . Smokeless tobacco: Never Used  Substance Use Topics  . Alcohol use: No  . Drug use: No         OPHTHALMIC EXAM:  Base Eye Exam    Visual Acuity (Snellen - Linear)      Right Left   Dist cc 20/20 20/20   Correction:  Glasses       Tonometry (Tonopen, 10:11 AM)      Right Left   Pressure 13 10       Pupils      Dark Light Shape React APD   Right 4 2 Round Brisk None   Left 4 2 Round Brisk None       Visual Fields (Counting fingers)      Left Right    Full Full       Extraocular Movement      Right Left    Full, Ortho Full, Ortho       Neuro/Psych    Oriented x3:  Yes   Mood/Affect:  Normal       Dilation    Both eyes:  1.0% Mydriacyl, 2.5% Phenylephrine @ 10:11 AM        Slit Lamp and Fundus Exam    Slit Lamp Exam      Right Left   Lids/Lashes Dermatochalasis - upper lid, Meibomian gland dysfunction, papillomas Dermatochalasis - upper lid, Meibomian gland dysfunction, papillomas   Conjunctiva/Sclera White and quiet White and quiet   Cornea 1+PEE, mild arcus 1+PEE, mild arcus   Anterior Chamber Deep and quiet Deep and quiet    Iris Round and dilated Round and dilated   Lens 2+ Nuclear sclerosis, 2+ Cortical  cataract 2+ Nuclear sclerosis, 2-3+ Cortical cataract   Vitreous Vitreous syneresis, Posterior vitreous detachment Vitreous syneresis, Posterior vitreous detachment with floaters, mild vitreous condensations inf macula       Fundus Exam      Right Left   Disc pink and sharp pink and sharp   C/D Ratio 0.3 0.25   Macula Mild Retinal pigment epithelial mottling, No heme or edema, trace ERM blunted foveal reflex, trace ERM, Retinal pigment epithelial mottling, no heme or edema   Vessels Mild Vascular attenuation, trace AV crossing changes Mild Vascular attenuation, trace AV crossing changes   Periphery attached, tear / impending/incomplete operculum w/ posterior pigment at 0930--no SRF; VR tuft at 1000 ant to equator, good laser surrounding both lesions operculated hole at 0200 -good laser surrounding, Horseshoe tear at 1030 with good laser surrounding, VR tuft at 0730 ora with good laser surrounding          IMAGING AND PROCEDURES  Imaging and Procedures for 11/07/17  OCT, Retina - OU - Both Eyes       Right Eye Quality was good. Central Foveal Thickness: 285. Progression has been stable. Findings include normal foveal contour, no IRF, no SRF.   Left Eye Quality was good. Central Foveal Thickness: 307. Progression has been stable. Findings include normal foveal contour, no IRF, no SRF (Trace vitreous opacities).   Notes Images taken, stored on drive  Diagnosis / Impression: OU-NFP, No IRF, No SRF  Clinical management:  See below  Abbreviations: NFP - Normal foveal profile. CME - cystoid macular edema. PED - pigment epithelial detachment. IRF - intraretinal fluid. SRF - subretinal fluid. EZ - ellipsoid zone. ERM - epiretinal membrane. ORA - outer retinal atrophy. ORT - outer retinal tubulation. SRHM - subretinal hyper-reflective material                ASSESSMENT/PLAN:    ICD-10-CM   1.  Horseshoe tear of retina, left H33.312   2. 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 Nuclear sclerosis of both eyes H25.13   4. Retinal edema H35.81 OCT, Retina - OU - Both Eyes    1. Horseshoe tear of retina with localized retinal detachment / subretinal fluid, OS  - s/p incomplete laser retinopexy OS (of 10.15.18) with Dr. Posey Pronto - s/p laser retinopexy OS (10.31.18) fill-in (11.09.18)  - laser looks great--stable - f/u in 6-9 months  2. Operculated hole without detachment OU - OS - operculated hole at 0200 equator - OD - tear / incomplete operculum located at 0930, VR tuft at 1000 - s/p laser retinopexy OS (10.31.18) -- good laser treatment in place - s/p laser retinopexy OD (11.21.19)--good laser treatment in place - Reviewed s/s of RT/RD - Strict return precautions for any such RT/RD signs/symptoms - f/u 6-9 mos  3. Nuclear sclerosis OU - The symptoms of cataract, surgical options, and treatments and risks were discussed with patient. - discussed diagnosis and progression - not yet visually significant - monitor for now  4. No retinal edema on exam or OCT   Ophthalmic Meds Ordered this visit:  No orders of the defined types were placed in this encounter.      Return 6-9 months, for DFE, OCT.  There are no Patient Instructions on file for this visit.   Explained the diagnoses, plan, and follow up with the patient and they expressed understanding.  Patient expressed understanding of the importance of proper follow up care.   This document serves as  a record of services personally performed by Gardiner Sleeper, MD, PhD. It was created on their behalf by Ernest Mallick, OA, an ophthalmic assistant. The creation of this record is the provider's dictation and/or activities during the visit.    Electronically signed by: Ernest Mallick, OA  12.12.19 1:56 PM    Gardiner Sleeper, M.D., Ph.D. Diseases & Surgery of the Retina and Vitreous [facility]  I have reviewed the above documentation for accuracy and completeness, and I agree with the above. Gardiner Sleeper, M.D., Ph.D. 12/01/18 1:58 PM    Abbreviations: M myopia (nearsighted); A astigmatism; H hyperopia (farsighted); P presbyopia; Mrx spectacle prescription;  CTL contact lenses; OD right eye; OS left eye; OU both eyes  XT exotropia; ET esotropia; PEK punctate epithelial keratitis; PEE punctate epithelial erosions; DES dry eye syndrome; MGD meibomian gland dysfunction; ATs artificial tears; PFAT's preservative free artificial tears; [place] nuclear sclerotic cataract; PSC posterior subcapsular cataract; ERM epi-retinal membrane; PVD posterior vitreous detachment; RD retinal detachment; DM diabetes mellitus; DR diabetic retinopathy; NPDR non-proliferative diabetic retinopathy; PDR proliferative diabetic retinopathy; CSME clinically significant macular edema; DME diabetic macular edema; dbh dot blot hemorrhages; CWS cotton wool spot; POAG primary open angle glaucoma; C/D cup-to-disc ratio; HVF humphrey visual field; GVF goldmann visual field; OCT optical coherence tomography; IOP intraocular pressure; BRVO Branch retinal vein occlusion; CRVO central retinal vein occlusion; CRAO central retinal artery occlusion; BRAO branch retinal artery occlusion; RT retinal tear; SB scleral buckle; PPV pars plana vitrectomy; VH Vitreous hemorrhage; PRP panretinal laser photocoagulation; IVK intravitreal kenalog; VMT vitreomacular traction; MH Macular hole;  NVD neovascularization of the disc; NVE neovascularization elsewhere; AREDS age related eye disease study; ARMD age related macular degeneration; POAG primary open angle glaucoma; EBMD epithelial/anterior basement membrane dystrophy; ACIOL anterior chamber intraocular lens; IOL intraocular lens; PCIOL posterior chamber intraocular lens; Phaco/IOL phacoemulsification with intraocular lens placement; [place] photorefractive keratectomy; LASIK laser  assisted in situ keratomileusis; HTN hypertension; DM diabetes mellitus; COPD chronic obstructive pulmonary disease

## 2018-11-29 ENCOUNTER — Ambulatory Visit (INDEPENDENT_AMBULATORY_CARE_PROVIDER_SITE_OTHER): Payer: PPO | Admitting: Ophthalmology

## 2018-11-29 ENCOUNTER — Encounter (INDEPENDENT_AMBULATORY_CARE_PROVIDER_SITE_OTHER): Payer: Self-pay | Admitting: Ophthalmology

## 2018-11-29 DIAGNOSIS — H2513 Age-related nuclear cataract, bilateral: Secondary | ICD-10-CM | POA: Diagnosis not present

## 2018-11-29 DIAGNOSIS — H33313 Horseshoe tear of retina without detachment, bilateral: Secondary | ICD-10-CM

## 2018-11-29 DIAGNOSIS — H33303 Unspecified retinal break, bilateral: Secondary | ICD-10-CM

## 2018-11-29 DIAGNOSIS — H3581 Retinal edema: Secondary | ICD-10-CM

## 2018-11-29 DIAGNOSIS — H33312 Horseshoe tear of retina without detachment, left eye: Secondary | ICD-10-CM

## 2018-12-01 ENCOUNTER — Encounter (INDEPENDENT_AMBULATORY_CARE_PROVIDER_SITE_OTHER): Payer: Self-pay | Admitting: Ophthalmology

## 2018-12-24 DIAGNOSIS — Z0000 Encounter for general adult medical examination without abnormal findings: Secondary | ICD-10-CM | POA: Diagnosis not present

## 2018-12-24 DIAGNOSIS — M545 Low back pain: Secondary | ICD-10-CM | POA: Diagnosis not present

## 2018-12-24 DIAGNOSIS — I10 Essential (primary) hypertension: Secondary | ICD-10-CM | POA: Diagnosis not present

## 2018-12-24 DIAGNOSIS — F325 Major depressive disorder, single episode, in full remission: Secondary | ICD-10-CM | POA: Diagnosis not present

## 2018-12-24 DIAGNOSIS — G609 Hereditary and idiopathic neuropathy, unspecified: Secondary | ICD-10-CM | POA: Diagnosis not present

## 2018-12-24 DIAGNOSIS — Z79899 Other long term (current) drug therapy: Secondary | ICD-10-CM | POA: Diagnosis not present

## 2018-12-24 DIAGNOSIS — E291 Testicular hypofunction: Secondary | ICD-10-CM | POA: Diagnosis not present

## 2018-12-24 DIAGNOSIS — Z1389 Encounter for screening for other disorder: Secondary | ICD-10-CM | POA: Diagnosis not present

## 2018-12-24 DIAGNOSIS — Z125 Encounter for screening for malignant neoplasm of prostate: Secondary | ICD-10-CM | POA: Diagnosis not present

## 2018-12-24 DIAGNOSIS — E7800 Pure hypercholesterolemia, unspecified: Secondary | ICD-10-CM | POA: Diagnosis not present

## 2018-12-24 DIAGNOSIS — N50811 Right testicular pain: Secondary | ICD-10-CM | POA: Diagnosis not present

## 2019-07-03 DIAGNOSIS — R413 Other amnesia: Secondary | ICD-10-CM | POA: Diagnosis not present

## 2019-07-03 DIAGNOSIS — I10 Essential (primary) hypertension: Secondary | ICD-10-CM | POA: Diagnosis not present

## 2019-07-03 DIAGNOSIS — G609 Hereditary and idiopathic neuropathy, unspecified: Secondary | ICD-10-CM | POA: Diagnosis not present

## 2019-07-15 DIAGNOSIS — I10 Essential (primary) hypertension: Secondary | ICD-10-CM | POA: Diagnosis not present

## 2019-07-15 DIAGNOSIS — R413 Other amnesia: Secondary | ICD-10-CM | POA: Diagnosis not present

## 2019-11-20 ENCOUNTER — Encounter (INDEPENDENT_AMBULATORY_CARE_PROVIDER_SITE_OTHER): Payer: PPO | Admitting: Ophthalmology

## 2019-11-20 DIAGNOSIS — H33021 Retinal detachment with multiple breaks, right eye: Secondary | ICD-10-CM

## 2019-11-20 DIAGNOSIS — H52201 Unspecified astigmatism, right eye: Secondary | ICD-10-CM | POA: Diagnosis not present

## 2019-11-20 DIAGNOSIS — H33303 Unspecified retinal break, bilateral: Secondary | ICD-10-CM

## 2019-11-20 DIAGNOSIS — H524 Presbyopia: Secondary | ICD-10-CM | POA: Diagnosis not present

## 2019-11-20 DIAGNOSIS — H43813 Vitreous degeneration, bilateral: Secondary | ICD-10-CM

## 2019-11-20 DIAGNOSIS — H5211 Myopia, right eye: Secondary | ICD-10-CM | POA: Diagnosis not present

## 2019-11-20 DIAGNOSIS — H2511 Age-related nuclear cataract, right eye: Secondary | ICD-10-CM | POA: Diagnosis not present

## 2019-11-20 DIAGNOSIS — H35033 Hypertensive retinopathy, bilateral: Secondary | ICD-10-CM | POA: Diagnosis not present

## 2019-11-20 DIAGNOSIS — H2513 Age-related nuclear cataract, bilateral: Secondary | ICD-10-CM

## 2019-11-20 DIAGNOSIS — I10 Essential (primary) hypertension: Secondary | ICD-10-CM

## 2019-11-20 DIAGNOSIS — H5202 Hypermetropia, left eye: Secondary | ICD-10-CM | POA: Diagnosis not present

## 2019-11-20 DIAGNOSIS — H25812 Combined forms of age-related cataract, left eye: Secondary | ICD-10-CM | POA: Diagnosis not present

## 2019-11-20 DIAGNOSIS — H31093 Other chorioretinal scars, bilateral: Secondary | ICD-10-CM | POA: Diagnosis not present

## 2019-12-02 ENCOUNTER — Other Ambulatory Visit: Payer: Self-pay

## 2019-12-02 ENCOUNTER — Encounter (INDEPENDENT_AMBULATORY_CARE_PROVIDER_SITE_OTHER): Payer: PPO | Admitting: Ophthalmology

## 2019-12-02 DIAGNOSIS — H33021 Retinal detachment with multiple breaks, right eye: Secondary | ICD-10-CM

## 2020-01-05 DIAGNOSIS — K219 Gastro-esophageal reflux disease without esophagitis: Secondary | ICD-10-CM | POA: Diagnosis not present

## 2020-01-05 DIAGNOSIS — E291 Testicular hypofunction: Secondary | ICD-10-CM | POA: Diagnosis not present

## 2020-01-05 DIAGNOSIS — E7800 Pure hypercholesterolemia, unspecified: Secondary | ICD-10-CM | POA: Diagnosis not present

## 2020-01-05 DIAGNOSIS — I10 Essential (primary) hypertension: Secondary | ICD-10-CM | POA: Diagnosis not present

## 2020-01-05 DIAGNOSIS — Z0000 Encounter for general adult medical examination without abnormal findings: Secondary | ICD-10-CM | POA: Diagnosis not present

## 2020-01-05 DIAGNOSIS — G609 Hereditary and idiopathic neuropathy, unspecified: Secondary | ICD-10-CM | POA: Diagnosis not present

## 2020-01-05 DIAGNOSIS — Z1389 Encounter for screening for other disorder: Secondary | ICD-10-CM | POA: Diagnosis not present

## 2020-01-05 DIAGNOSIS — Z79899 Other long term (current) drug therapy: Secondary | ICD-10-CM | POA: Diagnosis not present

## 2020-01-05 DIAGNOSIS — Z125 Encounter for screening for malignant neoplasm of prostate: Secondary | ICD-10-CM | POA: Diagnosis not present

## 2020-04-01 ENCOUNTER — Encounter (INDEPENDENT_AMBULATORY_CARE_PROVIDER_SITE_OTHER): Payer: PPO | Admitting: Ophthalmology

## 2020-04-01 DIAGNOSIS — I10 Essential (primary) hypertension: Secondary | ICD-10-CM

## 2020-04-01 DIAGNOSIS — H35033 Hypertensive retinopathy, bilateral: Secondary | ICD-10-CM

## 2020-04-01 DIAGNOSIS — H43813 Vitreous degeneration, bilateral: Secondary | ICD-10-CM | POA: Diagnosis not present

## 2020-04-01 DIAGNOSIS — H33303 Unspecified retinal break, bilateral: Secondary | ICD-10-CM

## 2020-07-05 DIAGNOSIS — I10 Essential (primary) hypertension: Secondary | ICD-10-CM | POA: Diagnosis not present

## 2020-07-05 DIAGNOSIS — R413 Other amnesia: Secondary | ICD-10-CM | POA: Diagnosis not present

## 2020-07-05 DIAGNOSIS — G609 Hereditary and idiopathic neuropathy, unspecified: Secondary | ICD-10-CM | POA: Diagnosis not present

## 2020-07-05 DIAGNOSIS — E7800 Pure hypercholesterolemia, unspecified: Secondary | ICD-10-CM | POA: Diagnosis not present

## 2020-07-05 DIAGNOSIS — K591 Functional diarrhea: Secondary | ICD-10-CM | POA: Diagnosis not present

## 2020-08-05 DIAGNOSIS — I10 Essential (primary) hypertension: Secondary | ICD-10-CM | POA: Diagnosis not present

## 2020-08-05 DIAGNOSIS — F325 Major depressive disorder, single episode, in full remission: Secondary | ICD-10-CM | POA: Diagnosis not present

## 2020-08-05 DIAGNOSIS — E7800 Pure hypercholesterolemia, unspecified: Secondary | ICD-10-CM | POA: Diagnosis not present

## 2020-11-06 DIAGNOSIS — F325 Major depressive disorder, single episode, in full remission: Secondary | ICD-10-CM | POA: Diagnosis not present

## 2020-11-06 DIAGNOSIS — K219 Gastro-esophageal reflux disease without esophagitis: Secondary | ICD-10-CM | POA: Diagnosis not present

## 2020-11-06 DIAGNOSIS — E7800 Pure hypercholesterolemia, unspecified: Secondary | ICD-10-CM | POA: Diagnosis not present

## 2020-11-06 DIAGNOSIS — I10 Essential (primary) hypertension: Secondary | ICD-10-CM | POA: Diagnosis not present

## 2021-01-10 DIAGNOSIS — G609 Hereditary and idiopathic neuropathy, unspecified: Secondary | ICD-10-CM | POA: Diagnosis not present

## 2021-01-10 DIAGNOSIS — Z0000 Encounter for general adult medical examination without abnormal findings: Secondary | ICD-10-CM | POA: Diagnosis not present

## 2021-01-10 DIAGNOSIS — E7800 Pure hypercholesterolemia, unspecified: Secondary | ICD-10-CM | POA: Diagnosis not present

## 2021-01-10 DIAGNOSIS — Z1389 Encounter for screening for other disorder: Secondary | ICD-10-CM | POA: Diagnosis not present

## 2021-01-10 DIAGNOSIS — F325 Major depressive disorder, single episode, in full remission: Secondary | ICD-10-CM | POA: Diagnosis not present

## 2021-01-10 DIAGNOSIS — I10 Essential (primary) hypertension: Secondary | ICD-10-CM | POA: Diagnosis not present

## 2021-01-10 DIAGNOSIS — K219 Gastro-esophageal reflux disease without esophagitis: Secondary | ICD-10-CM | POA: Diagnosis not present

## 2021-01-10 DIAGNOSIS — K9089 Other intestinal malabsorption: Secondary | ICD-10-CM | POA: Diagnosis not present

## 2021-01-10 DIAGNOSIS — Z79899 Other long term (current) drug therapy: Secondary | ICD-10-CM | POA: Diagnosis not present

## 2021-02-02 DIAGNOSIS — I10 Essential (primary) hypertension: Secondary | ICD-10-CM | POA: Diagnosis not present

## 2021-02-02 DIAGNOSIS — K219 Gastro-esophageal reflux disease without esophagitis: Secondary | ICD-10-CM | POA: Diagnosis not present

## 2021-02-02 DIAGNOSIS — F325 Major depressive disorder, single episode, in full remission: Secondary | ICD-10-CM | POA: Diagnosis not present

## 2021-02-02 DIAGNOSIS — E7800 Pure hypercholesterolemia, unspecified: Secondary | ICD-10-CM | POA: Diagnosis not present

## 2021-03-17 DIAGNOSIS — I10 Essential (primary) hypertension: Secondary | ICD-10-CM | POA: Diagnosis not present

## 2021-03-17 DIAGNOSIS — E7800 Pure hypercholesterolemia, unspecified: Secondary | ICD-10-CM | POA: Diagnosis not present

## 2021-03-17 DIAGNOSIS — F325 Major depressive disorder, single episode, in full remission: Secondary | ICD-10-CM | POA: Diagnosis not present

## 2021-03-17 DIAGNOSIS — K219 Gastro-esophageal reflux disease without esophagitis: Secondary | ICD-10-CM | POA: Diagnosis not present

## 2021-03-22 DIAGNOSIS — H524 Presbyopia: Secondary | ICD-10-CM | POA: Diagnosis not present

## 2021-03-22 DIAGNOSIS — H2511 Age-related nuclear cataract, right eye: Secondary | ICD-10-CM | POA: Diagnosis not present

## 2021-03-22 DIAGNOSIS — H5202 Hypermetropia, left eye: Secondary | ICD-10-CM | POA: Diagnosis not present

## 2021-03-22 DIAGNOSIS — H5211 Myopia, right eye: Secondary | ICD-10-CM | POA: Diagnosis not present

## 2021-03-22 DIAGNOSIS — H25812 Combined forms of age-related cataract, left eye: Secondary | ICD-10-CM | POA: Diagnosis not present

## 2021-03-22 DIAGNOSIS — H31093 Other chorioretinal scars, bilateral: Secondary | ICD-10-CM | POA: Diagnosis not present

## 2021-03-22 DIAGNOSIS — H43813 Vitreous degeneration, bilateral: Secondary | ICD-10-CM | POA: Diagnosis not present

## 2021-03-22 DIAGNOSIS — H52203 Unspecified astigmatism, bilateral: Secondary | ICD-10-CM | POA: Diagnosis not present

## 2021-03-28 ENCOUNTER — Encounter (INDEPENDENT_AMBULATORY_CARE_PROVIDER_SITE_OTHER): Payer: PPO | Admitting: Ophthalmology

## 2021-03-28 ENCOUNTER — Other Ambulatory Visit: Payer: Self-pay

## 2021-03-28 DIAGNOSIS — H43813 Vitreous degeneration, bilateral: Secondary | ICD-10-CM | POA: Diagnosis not present

## 2021-03-28 DIAGNOSIS — H33303 Unspecified retinal break, bilateral: Secondary | ICD-10-CM | POA: Diagnosis not present

## 2021-03-28 DIAGNOSIS — H2513 Age-related nuclear cataract, bilateral: Secondary | ICD-10-CM | POA: Diagnosis not present

## 2021-03-28 DIAGNOSIS — I10 Essential (primary) hypertension: Secondary | ICD-10-CM

## 2021-03-28 DIAGNOSIS — H35033 Hypertensive retinopathy, bilateral: Secondary | ICD-10-CM | POA: Diagnosis not present

## 2021-04-04 ENCOUNTER — Encounter (INDEPENDENT_AMBULATORY_CARE_PROVIDER_SITE_OTHER): Payer: PPO | Admitting: Ophthalmology

## 2021-04-28 DIAGNOSIS — F325 Major depressive disorder, single episode, in full remission: Secondary | ICD-10-CM | POA: Diagnosis not present

## 2021-04-28 DIAGNOSIS — K219 Gastro-esophageal reflux disease without esophagitis: Secondary | ICD-10-CM | POA: Diagnosis not present

## 2021-04-28 DIAGNOSIS — E7800 Pure hypercholesterolemia, unspecified: Secondary | ICD-10-CM | POA: Diagnosis not present

## 2021-04-28 DIAGNOSIS — I10 Essential (primary) hypertension: Secondary | ICD-10-CM | POA: Diagnosis not present

## 2021-05-27 DIAGNOSIS — I10 Essential (primary) hypertension: Secondary | ICD-10-CM | POA: Diagnosis not present

## 2021-05-27 DIAGNOSIS — F325 Major depressive disorder, single episode, in full remission: Secondary | ICD-10-CM | POA: Diagnosis not present

## 2021-05-27 DIAGNOSIS — E7800 Pure hypercholesterolemia, unspecified: Secondary | ICD-10-CM | POA: Diagnosis not present

## 2021-05-27 DIAGNOSIS — K219 Gastro-esophageal reflux disease without esophagitis: Secondary | ICD-10-CM | POA: Diagnosis not present

## 2021-07-08 DIAGNOSIS — K219 Gastro-esophageal reflux disease without esophagitis: Secondary | ICD-10-CM | POA: Diagnosis not present

## 2021-07-08 DIAGNOSIS — I10 Essential (primary) hypertension: Secondary | ICD-10-CM | POA: Diagnosis not present

## 2021-07-08 DIAGNOSIS — E7800 Pure hypercholesterolemia, unspecified: Secondary | ICD-10-CM | POA: Diagnosis not present

## 2021-07-08 DIAGNOSIS — G609 Hereditary and idiopathic neuropathy, unspecified: Secondary | ICD-10-CM | POA: Diagnosis not present

## 2021-07-08 DIAGNOSIS — E291 Testicular hypofunction: Secondary | ICD-10-CM | POA: Diagnosis not present

## 2021-07-08 DIAGNOSIS — F325 Major depressive disorder, single episode, in full remission: Secondary | ICD-10-CM | POA: Diagnosis not present

## 2021-07-08 DIAGNOSIS — Z79899 Other long term (current) drug therapy: Secondary | ICD-10-CM | POA: Diagnosis not present

## 2021-07-08 DIAGNOSIS — Z0000 Encounter for general adult medical examination without abnormal findings: Secondary | ICD-10-CM | POA: Diagnosis not present

## 2021-08-03 DIAGNOSIS — K219 Gastro-esophageal reflux disease without esophagitis: Secondary | ICD-10-CM | POA: Diagnosis not present

## 2021-08-03 DIAGNOSIS — I10 Essential (primary) hypertension: Secondary | ICD-10-CM | POA: Diagnosis not present

## 2021-08-03 DIAGNOSIS — E7800 Pure hypercholesterolemia, unspecified: Secondary | ICD-10-CM | POA: Diagnosis not present

## 2021-08-03 DIAGNOSIS — F325 Major depressive disorder, single episode, in full remission: Secondary | ICD-10-CM | POA: Diagnosis not present

## 2022-01-11 ENCOUNTER — Ambulatory Visit
Admission: RE | Admit: 2022-01-11 | Discharge: 2022-01-11 | Disposition: A | Discharge status: Home / Self Care | Payer: PPO | Source: Ambulatory Visit | Attending: Geriatric Medicine | Admitting: Geriatric Medicine

## 2022-01-11 ENCOUNTER — Other Ambulatory Visit (HOSPITAL_COMMUNITY): Payer: Self-pay | Admitting: Geriatric Medicine

## 2022-01-11 ENCOUNTER — Other Ambulatory Visit: Payer: Self-pay | Admitting: Geriatric Medicine

## 2022-01-11 DIAGNOSIS — R0789 Other chest pain: Secondary | ICD-10-CM

## 2022-01-11 DIAGNOSIS — R052 Subacute cough: Secondary | ICD-10-CM | POA: Diagnosis not present

## 2022-01-11 DIAGNOSIS — G609 Hereditary and idiopathic neuropathy, unspecified: Secondary | ICD-10-CM | POA: Diagnosis not present

## 2022-01-11 DIAGNOSIS — I10 Essential (primary) hypertension: Secondary | ICD-10-CM | POA: Diagnosis not present

## 2022-04-24 DIAGNOSIS — F32A Depression, unspecified: Secondary | ICD-10-CM | POA: Diagnosis not present

## 2022-04-24 DIAGNOSIS — Z7689 Persons encountering health services in other specified circumstances: Secondary | ICD-10-CM | POA: Diagnosis not present

## 2022-04-24 DIAGNOSIS — I10 Essential (primary) hypertension: Secondary | ICD-10-CM | POA: Diagnosis not present

## 2022-04-24 DIAGNOSIS — E785 Hyperlipidemia, unspecified: Secondary | ICD-10-CM | POA: Diagnosis not present

## 2022-04-24 DIAGNOSIS — G629 Polyneuropathy, unspecified: Secondary | ICD-10-CM | POA: Diagnosis not present

## 2022-05-26 DIAGNOSIS — Z1211 Encounter for screening for malignant neoplasm of colon: Secondary | ICD-10-CM | POA: Diagnosis not present

## 2022-05-26 DIAGNOSIS — R0602 Shortness of breath: Secondary | ICD-10-CM | POA: Diagnosis not present

## 2022-05-26 DIAGNOSIS — I10 Essential (primary) hypertension: Secondary | ICD-10-CM | POA: Diagnosis not present

## 2022-05-26 DIAGNOSIS — G629 Polyneuropathy, unspecified: Secondary | ICD-10-CM | POA: Diagnosis not present

## 2022-05-26 DIAGNOSIS — Z1212 Encounter for screening for malignant neoplasm of rectum: Secondary | ICD-10-CM | POA: Diagnosis not present

## 2022-05-26 DIAGNOSIS — F411 Generalized anxiety disorder: Secondary | ICD-10-CM | POA: Diagnosis not present

## 2022-05-26 DIAGNOSIS — E785 Hyperlipidemia, unspecified: Secondary | ICD-10-CM | POA: Diagnosis not present

## 2022-06-26 DIAGNOSIS — K219 Gastro-esophageal reflux disease without esophagitis: Secondary | ICD-10-CM | POA: Diagnosis not present

## 2022-06-26 DIAGNOSIS — R0602 Shortness of breath: Secondary | ICD-10-CM | POA: Diagnosis not present

## 2022-07-28 DIAGNOSIS — J209 Acute bronchitis, unspecified: Secondary | ICD-10-CM | POA: Diagnosis not present

## 2022-07-28 DIAGNOSIS — R0602 Shortness of breath: Secondary | ICD-10-CM | POA: Diagnosis not present

## 2022-07-28 DIAGNOSIS — R059 Cough, unspecified: Secondary | ICD-10-CM | POA: Diagnosis not present

## 2022-08-09 DIAGNOSIS — K4090 Unilateral inguinal hernia, without obstruction or gangrene, not specified as recurrent: Secondary | ICD-10-CM | POA: Diagnosis not present

## 2022-08-09 DIAGNOSIS — G609 Hereditary and idiopathic neuropathy, unspecified: Secondary | ICD-10-CM | POA: Diagnosis not present

## 2022-08-09 DIAGNOSIS — R490 Dysphonia: Secondary | ICD-10-CM | POA: Diagnosis not present

## 2022-08-09 DIAGNOSIS — E7800 Pure hypercholesterolemia, unspecified: Secondary | ICD-10-CM | POA: Diagnosis not present

## 2022-08-09 DIAGNOSIS — Z79899 Other long term (current) drug therapy: Secondary | ICD-10-CM | POA: Diagnosis not present

## 2022-08-09 DIAGNOSIS — K219 Gastro-esophageal reflux disease without esophagitis: Secondary | ICD-10-CM | POA: Diagnosis not present

## 2022-08-09 DIAGNOSIS — I10 Essential (primary) hypertension: Secondary | ICD-10-CM | POA: Diagnosis not present

## 2022-08-09 DIAGNOSIS — Z0000 Encounter for general adult medical examination without abnormal findings: Secondary | ICD-10-CM | POA: Diagnosis not present

## 2022-08-09 DIAGNOSIS — F325 Major depressive disorder, single episode, in full remission: Secondary | ICD-10-CM | POA: Diagnosis not present

## 2022-08-14 DIAGNOSIS — Z1212 Encounter for screening for malignant neoplasm of rectum: Secondary | ICD-10-CM | POA: Diagnosis not present

## 2022-08-14 DIAGNOSIS — Z1211 Encounter for screening for malignant neoplasm of colon: Secondary | ICD-10-CM | POA: Diagnosis not present

## 2022-08-28 DIAGNOSIS — R059 Cough, unspecified: Secondary | ICD-10-CM | POA: Diagnosis not present

## 2022-08-28 DIAGNOSIS — Z23 Encounter for immunization: Secondary | ICD-10-CM | POA: Diagnosis not present

## 2022-10-11 DIAGNOSIS — K219 Gastro-esophageal reflux disease without esophagitis: Secondary | ICD-10-CM | POA: Diagnosis not present

## 2022-10-11 DIAGNOSIS — I10 Essential (primary) hypertension: Secondary | ICD-10-CM | POA: Diagnosis not present

## 2022-10-11 DIAGNOSIS — E785 Hyperlipidemia, unspecified: Secondary | ICD-10-CM | POA: Diagnosis not present

## 2022-10-11 DIAGNOSIS — R413 Other amnesia: Secondary | ICD-10-CM | POA: Diagnosis not present

## 2022-10-11 DIAGNOSIS — G629 Polyneuropathy, unspecified: Secondary | ICD-10-CM | POA: Diagnosis not present

## 2022-11-28 DIAGNOSIS — R413 Other amnesia: Secondary | ICD-10-CM | POA: Diagnosis not present

## 2022-12-01 DIAGNOSIS — R413 Other amnesia: Secondary | ICD-10-CM | POA: Diagnosis not present

## 2022-12-01 DIAGNOSIS — G319 Degenerative disease of nervous system, unspecified: Secondary | ICD-10-CM | POA: Diagnosis not present

## 2022-12-08 DIAGNOSIS — R413 Other amnesia: Secondary | ICD-10-CM | POA: Diagnosis not present

## 2022-12-08 DIAGNOSIS — H6691 Otitis media, unspecified, right ear: Secondary | ICD-10-CM | POA: Diagnosis not present

## 2022-12-25 IMAGING — DX DG CHEST 2V
2 series · 2 of 2 positions shown · non-contrast
Comparison: X-ray chest 03/30/2018.

CLINICAL DATA: Chest pressure for 4 months. History of
hypertension.

EXAM:
CHEST - 2 VIEW

[dg chest 2 view (1 of 2)]
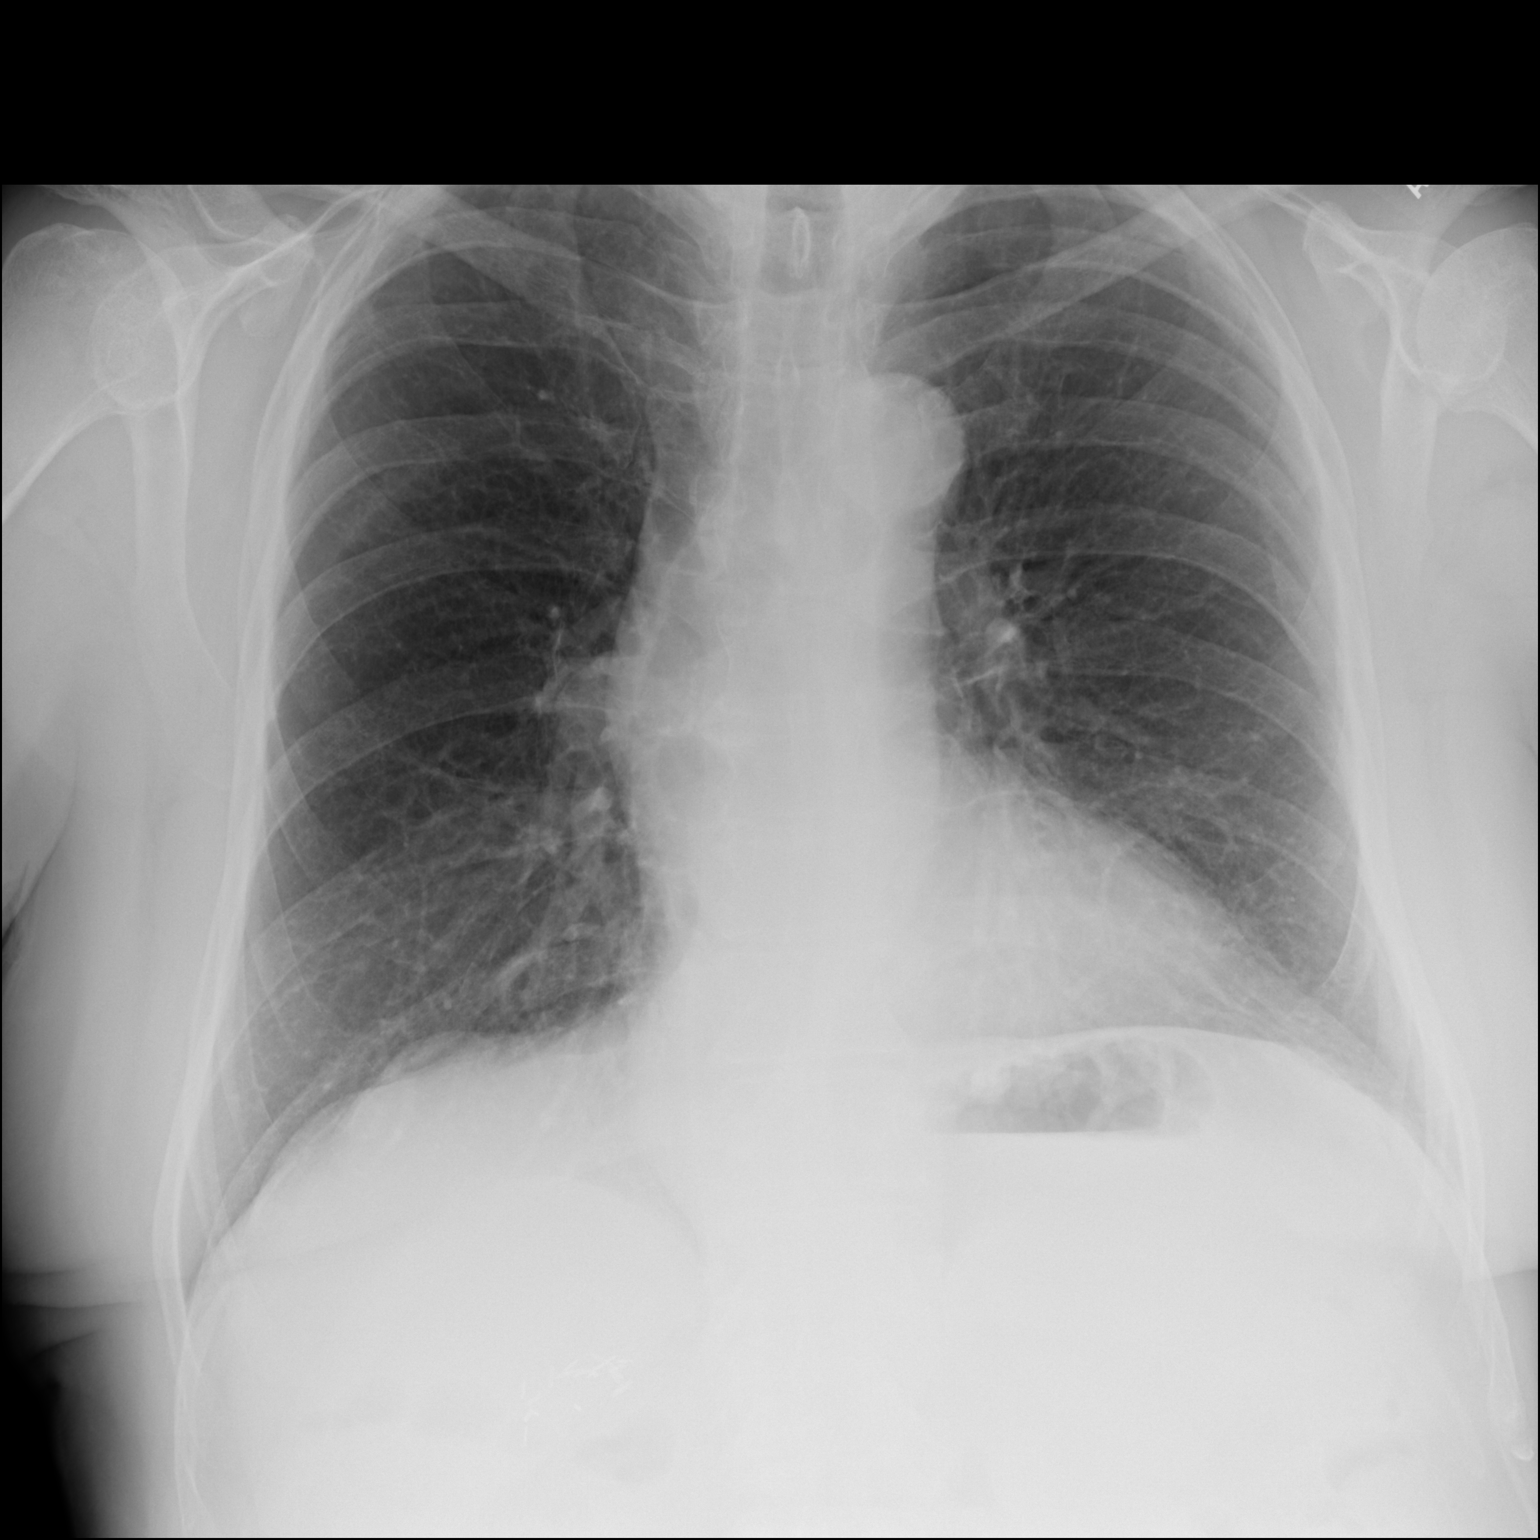

[dg chest 2 view (2 of 2)]
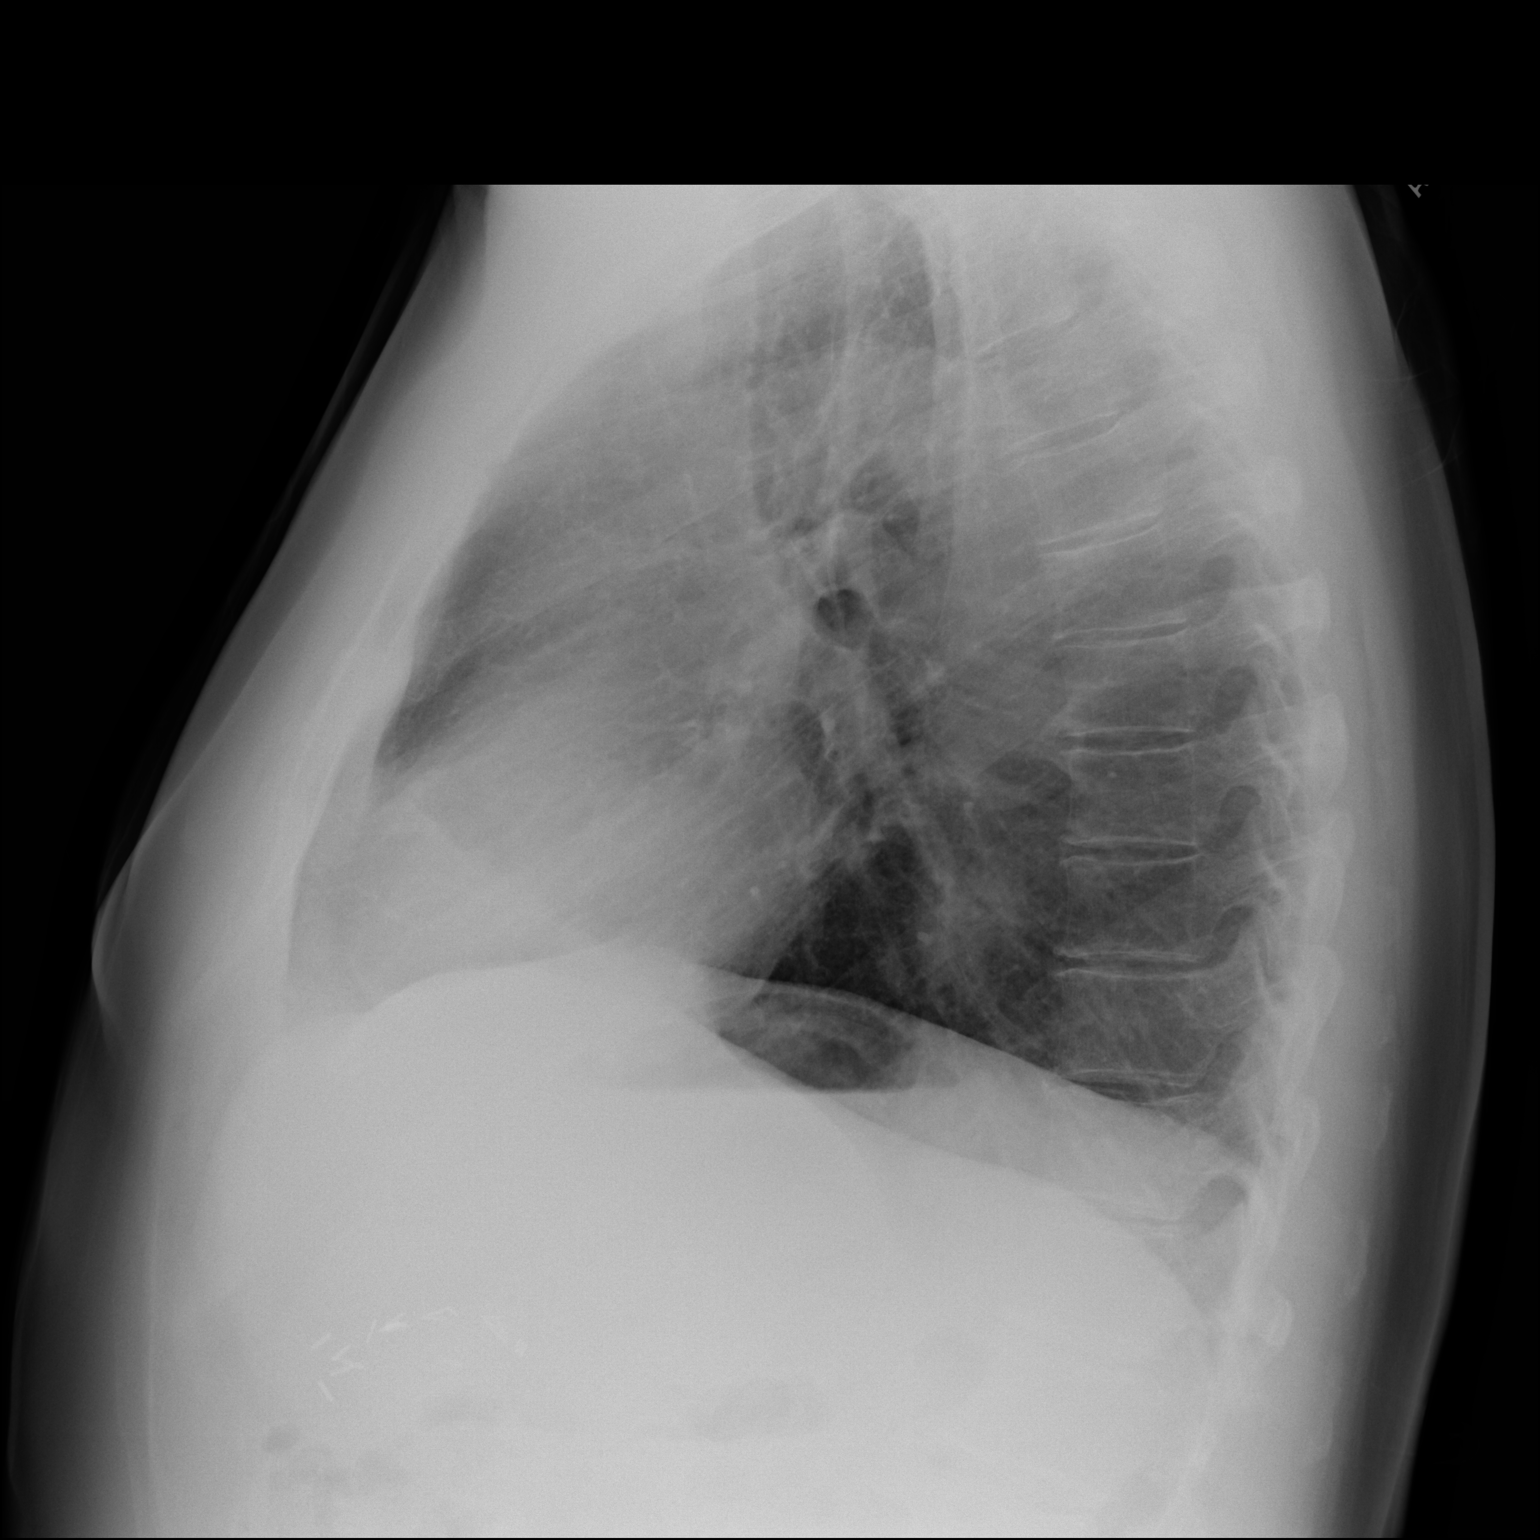

[2 of 2 positions shown; findings below may reference images not displayed]

FINDINGS: Stable cardiomediastinal contours. No focal airspace consolidation,
pleural effusion, or pneumothorax. No acute bony findings.
IMPRESSION: No active cardiopulmonary disease.
